# Patient Record
Sex: Male | Born: 1950 | Race: White | Hispanic: No | Marital: Married | Smoking: Never smoker
Health system: Southern US, Community
[De-identification: ages and names within clinical notes are randomized; demographics above are authoritative.]

## PROBLEM LIST (undated history)

## (undated) DIAGNOSIS — K219 Gastro-esophageal reflux disease without esophagitis: Secondary | ICD-10-CM

## (undated) DIAGNOSIS — J45909 Unspecified asthma, uncomplicated: Secondary | ICD-10-CM

## (undated) DIAGNOSIS — I10 Essential (primary) hypertension: Secondary | ICD-10-CM

## (undated) DIAGNOSIS — E785 Hyperlipidemia, unspecified: Secondary | ICD-10-CM

## (undated) HISTORY — DX: Hyperlipidemia, unspecified: E78.5

## (undated) HISTORY — DX: Unspecified asthma, uncomplicated: J45.909

## (undated) HISTORY — DX: Gastro-esophageal reflux disease without esophagitis: K21.9

## (undated) HISTORY — DX: Essential (primary) hypertension: I10

---
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
## 1970-09-09 HISTORY — PX: APPENDECTOMY: SHX54

## 1983-09-10 HISTORY — PX: VASECTOMY: SHX75

## 1998-04-06 ENCOUNTER — Ambulatory Visit (HOSPITAL_COMMUNITY): Admission: RE | Admit: 1998-04-06 | Discharge: 1998-04-06 | Payer: Self-pay | Admitting: Cardiology

## 1998-06-26 ENCOUNTER — Ambulatory Visit (HOSPITAL_COMMUNITY): Admission: RE | Admit: 1998-06-26 | Discharge: 1998-06-26 | Payer: Self-pay | Admitting: Gastroenterology

## 2004-01-09 ENCOUNTER — Ambulatory Visit (HOSPITAL_COMMUNITY): Admission: RE | Admit: 2004-01-09 | Discharge: 2004-01-09 | Payer: Self-pay | Admitting: Gastroenterology

## 2004-07-19 ENCOUNTER — Ambulatory Visit (HOSPITAL_COMMUNITY): Admission: RE | Admit: 2004-07-19 | Discharge: 2004-07-19 | Payer: Self-pay | Admitting: Family Medicine

## 2004-07-24 ENCOUNTER — Ambulatory Visit (HOSPITAL_COMMUNITY): Admission: RE | Admit: 2004-07-24 | Discharge: 2004-07-24 | Payer: Self-pay | Admitting: Family Medicine

## 2004-08-07 ENCOUNTER — Ambulatory Visit (HOSPITAL_BASED_OUTPATIENT_CLINIC_OR_DEPARTMENT_OTHER): Admission: RE | Admit: 2004-08-07 | Discharge: 2004-08-07 | Payer: Self-pay | Admitting: Family Medicine

## 2012-02-08 ENCOUNTER — Emergency Department: Payer: Self-pay | Admitting: Emergency Medicine

## 2014-12-27 ENCOUNTER — Other Ambulatory Visit: Payer: Self-pay | Admitting: Orthopedic Surgery

## 2014-12-27 DIAGNOSIS — M25512 Pain in left shoulder: Secondary | ICD-10-CM

## 2015-01-05 ENCOUNTER — Ambulatory Visit
Admission: RE | Admit: 2015-01-05 | Discharge: 2015-01-05 | Disposition: A | Discharge status: Home / Self Care | Payer: Self-pay | Source: Ambulatory Visit | Attending: Orthopedic Surgery | Admitting: Orthopedic Surgery

## 2015-01-05 DIAGNOSIS — M25512 Pain in left shoulder: Secondary | ICD-10-CM

## 2015-09-10 HISTORY — PX: ROTATOR CUFF REPAIR: SHX139

## 2016-09-09 HISTORY — PX: BACK SURGERY: SHX140

## 2017-05-02 ENCOUNTER — Other Ambulatory Visit: Payer: Self-pay | Admitting: Orthopedic Surgery

## 2017-05-02 DIAGNOSIS — G8929 Other chronic pain: Secondary | ICD-10-CM

## 2017-05-02 DIAGNOSIS — M25561 Pain in right knee: Principal | ICD-10-CM

## 2017-05-04 ENCOUNTER — Ambulatory Visit
Admission: RE | Admit: 2017-05-04 | Discharge: 2017-05-04 | Disposition: A | Discharge status: Home / Self Care | Payer: PRIVATE HEALTH INSURANCE | Source: Ambulatory Visit | Attending: Orthopedic Surgery | Admitting: Orthopedic Surgery

## 2017-05-04 DIAGNOSIS — G8929 Other chronic pain: Secondary | ICD-10-CM

## 2017-05-04 DIAGNOSIS — M25561 Pain in right knee: Principal | ICD-10-CM

## 2018-04-06 ENCOUNTER — Telehealth: Payer: Self-pay

## 2018-04-06 NOTE — Telephone Encounter (Signed)
SENT REFERRAL TO SCHEDULING AND FILED NOTES 

## 2018-05-20 NOTE — Progress Notes (Signed)
  Cardiology Office Note   Date:  05/21/2018   ID:  Jesse Hardy, DOB 11/30/1950, MRN [id number]  PCP:  Briscoe, Kim K, MD  Cardiologist:   No primary care provider on file. Referring:  Briscoe, Kim K, MD   Chief Complaint  Patient presents with  . Heart Murmur      History of Present Illness: Jesse Hardy is a 66 y.o. male who is referred by Briscoe, Kim K, MD for evaluation of murmur.  The patient has no past cardiac history but he does have a history of a heart murmur as a child.  He had a treadmill test years ago that he describes as a nuclear stress test but apparently it was normal.  He had bradycardia noted but no symptoms related to this.  He was referred for evaluation of the murmur.  He also has a family history with his brother having heart disease a young age.  He is active.  He does get a ditch recently and he had no problems with this.  However, he has been limited by some back and leg pain related to his back.  He is going to get this looked at.  He does some walking.  He might get some shortness of breath with this.  He is had some fleeting chest discomfort.  This is happens sporadically and not associated with activity. The patient denies any new symptoms such as chest discomfort, neck or arm discomfort. There has been no PND or orthopnea. There have been no reported palpitations, presyncope or syncope.  Past Medical History:  Diagnosis Date  . Asthma   . GERD (gastroesophageal reflux disease)   . Hyperlipidemia   . Hypertension    Past Surgical History:  Procedure Laterality Date  . APPENDECTOMY  1972  . BACK SURGERY  2018  . ROTATOR CUFF REPAIR  2017  . TONSILECTOMY, ADENOIDECTOMY, BILATERAL MYRINGOTOMY AND TUBES  1958  . VASECTOMY  1985    Current Outpatient Medications  Medication Sig Dispense Refill  . ammonium lactate (LAC-HYDRIN) 12 % lotion Apply topically as needed. For dry skin    . aspirin 81 MG tablet Take 325 mg by mouth daily.    .  azelastine (ASTELIN) 0.1 % nasal spray Place 1 spray into both nostrils 2 (two) times daily as needed.    . chlorthalidone (HYGROTON) 25 MG tablet Take 1 tablet by mouth daily.    . clotrimazole (LOTRIMIN) 1 % cream Apply topically 2 (two) times daily.    . cyclobenzaprine (FLEXERIL) 10 MG tablet Take 1 tablet by mouth 3 (three) times daily as needed. For muscle spasms    . doxazosin (CARDURA) 2 MG tablet Take 3 tablets by mouth at bedtime.    . ipratropium (ATROVENT) 0.03 % nasal spray Place 1 spray into both nostrils 2 (two) times daily as needed.    . losartan (COZAAR) 50 MG tablet Take 1 tablet by mouth daily.    . meloxicam (MOBIC) 15 MG tablet Take 1 tablet by mouth daily.    . montelukast (SINGULAIR) 10 MG tablet Take 1 tablet by mouth at bedtime.    . Multiple Vitamin (MULTIVITAMIN) tablet Take 1 tablet by mouth daily.    . Omega-3 Fatty Acids (FISH OIL OMEGA-3) 1000 MG CAPS Take 2 g by mouth daily.    . omeprazole (PRILOSEC) 20 MG capsule Take 1 capsule by mouth daily.    . pravastatin (PRAVACHOL) 20 MG tablet Take 1 tablet by mouth daily.    .   ranitidine (ZANTAC) 300 MG tablet Take 1 tablet by mouth daily.    . tamsulosin (FLOMAX) 0.4 MG CAPS capsule Take 1 capsule by mouth daily.    . triamcinolone cream (KENALOG) 0.1 % Apply 1 application topically 2 (two) times daily as needed.  1   No current facility-administered medications for this visit.     Allergies:   Patient has no allergy information on record.    Social History:  The patient  reports that he has never smoked. He has never used smokeless tobacco. He reports that he does not drink alcohol or use drugs.   Family History:  The patient's family history includes Colon cancer in his father; Heart attack in his father and mother; Heart attack (age of onset: 53) in his brother; Heart disease in his brother; Stroke in his father.    ROS:  Please see the history of present illness.   Otherwise, review of systems are positive  for none.   All other systems are reviewed and negative.    PHYSICAL EXAM: VS:  BP 106/72 (BP Location: Right Arm, Patient Position: Sitting, Cuff Size: Normal)   Pulse (!) 47   Ht 5' 6" (1.676 m)   Wt 214 lb (97.1 kg)   BMI 34.54 kg/m  , BMI Body mass index is 34.54 kg/m. GENERAL:  Well appearing HEENT:  Pupils equal round and reactive, fundi not visualized, oral mucosa unremarkable NECK:  No jugular venous distention, waveform within normal limits, carotid upstroke brisk and symmetric, no bruits, no thyromegaly LYMPHATICS:  No cervical, inguinal adenopathy LUNGS:  Clear to auscultation bilaterally BACK:  No CVA tenderness CHEST:  Unremarkable HEART:  PMI not displaced or sustained,S1 and S2 within normal limits, no S3, no S4, no clicks, no rubs, 2 out of 6 apical systolic murmur heard best at the right upper sternal border nonradiating, no diastolic murmurs ABD:  Flat, positive bowel sounds normal in frequency in pitch, no bruits, no rebound, no guarding, no midline pulsatile mass, no hepatomegaly, no splenomegaly EXT:  2 plus pulses throughout, mild left leg edema, no cyanosis no clubbing SKIN:  No rashes no nodules NEURO:  Cranial nerves II through XII grossly intact, motor grossly intact throughout PSYCH:  Cognitively intact, oriented to person place and time    EKG:  EKG is ordered today. The ekg ordered today demonstrates sinus bradycardia, rate 47, axis within normal limits, intervals within normal limits, no acute ST-T wave changes.     Recent Labs: No results found for requested labs within last 8760 hours.    Lipid Panel No results found for: CHOL, TRIG, HDL, CHOLHDL, VLDL, LDLCALC, LDLDIRECT    Wt Readings from Last 3 Encounters:  05/21/18 214 lb (97.1 kg)      Other studies Reviewed: Additional studies/ records that were reviewed today include: Outside labs. Review of the above records demonstrates:  Please see elsewhere in the note.     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  No further work-up is suggested.  MURMUR: I suggest that this is likely related to aortic sclerosis or mild stenosis but he will get an echocardiogram to follow-up.  SOB/CHEST PAIN: Given this  and his family history I will bring the patient back for a POET (Plain Old Exercise Test). This will allow me to screen for obstructive coronary disease, risk stratify and very importantly provide a prescription for exercise.  Current medicines are reviewed at length with   the patient today.  The patient does not have concerns regarding medicines.  The following changes have been made:  no change  Labs/ tests ordered today include:   Orders Placed This Encounter  Procedures  . Exercise Tolerance Test  . EKG 12-Lead  . ECHOCARDIOGRAM COMPLETE     Disposition:   FU with me based on the results of the above.     Signed, Pama Roskos, MD  05/21/2018 11:27 AM    [facility]    

## 2018-05-20 NOTE — H&P (View-Only) (Signed)
Cardiology Office Note   Date:  05/21/2018   ID:  Jesse Hardy, DOB 12-Jun-1951, MRN [id number]  PCP:  Macy Mis, MD  Cardiologist:   No primary care provider on file. Referring:  Macy Mis, MD   Chief Complaint  Patient presents with  . Heart Murmur      History of Present Illness: Jesse Hardy is a 67 y.o. male who is referred by Macy Mis, MD for evaluation of murmur.  The patient has no past cardiac history but he does have a history of a heart murmur as a child.  He had a treadmill test years ago that he describes as a nuclear stress test but apparently it was normal.  He had bradycardia noted but no symptoms related to this.  He was referred for evaluation of the murmur.  He also has a family history with his brother having heart disease a young age.  He is active.  He does get a ditch recently and he had no problems with this.  However, he has been limited by some back and leg pain related to his back.  He is going to get this looked at.  He does some walking.  He might get some shortness of breath with this.  He is had some fleeting chest discomfort.  This is happens sporadically and not associated with activity. The patient denies any new symptoms such as chest discomfort, neck or arm discomfort. There has been no PND or orthopnea. There have been no reported palpitations, presyncope or syncope.  Past Medical History:  Diagnosis Date  . Asthma   . GERD (gastroesophageal reflux disease)   . Hyperlipidemia   . Hypertension    Past Surgical History:  Procedure Laterality Date  . APPENDECTOMY  1972  . BACK SURGERY  2018  . ROTATOR CUFF REPAIR  2017  . TONSILECTOMY, ADENOIDECTOMY, BILATERAL MYRINGOTOMY AND TUBES  1958  . VASECTOMY  1985    Current Outpatient Medications  Medication Sig Dispense Refill  . ammonium lactate (LAC-HYDRIN) 12 % lotion Apply topically as needed. For dry skin    . aspirin 81 MG tablet Take 325 mg by mouth daily.    Marland Kitchen  azelastine (ASTELIN) 0.1 % nasal spray Place 1 spray into both nostrils 2 (two) times daily as needed.    . chlorthalidone (HYGROTON) 25 MG tablet Take 1 tablet by mouth daily.    . clotrimazole (LOTRIMIN) 1 % cream Apply topically 2 (two) times daily.    . cyclobenzaprine (FLEXERIL) 10 MG tablet Take 1 tablet by mouth 3 (three) times daily as needed. For muscle spasms    . doxazosin (CARDURA) 2 MG tablet Take 3 tablets by mouth at bedtime.    Marland Kitchen ipratropium (ATROVENT) 0.03 % nasal spray Place 1 spray into both nostrils 2 (two) times daily as needed.    Marland Kitchen losartan (COZAAR) 50 MG tablet Take 1 tablet by mouth daily.    . meloxicam (MOBIC) 15 MG tablet Take 1 tablet by mouth daily.    . montelukast (SINGULAIR) 10 MG tablet Take 1 tablet by mouth at bedtime.    . Multiple Vitamin (MULTIVITAMIN) tablet Take 1 tablet by mouth daily.    . Omega-3 Fatty Acids (FISH OIL OMEGA-3) 1000 MG CAPS Take 2 g by mouth daily.    Marland Kitchen omeprazole (PRILOSEC) 20 MG capsule Take 1 capsule by mouth daily.    . pravastatin (PRAVACHOL) 20 MG tablet Take 1 tablet by mouth daily.    Marland Kitchen  ranitidine (ZANTAC) 300 MG tablet Take 1 tablet by mouth daily.    . tamsulosin (FLOMAX) 0.4 MG CAPS capsule Take 1 capsule by mouth daily.    Marland Kitchen triamcinolone cream (KENALOG) 0.1 % Apply 1 application topically 2 (two) times daily as needed.  1   No current facility-administered medications for this visit.     Allergies:   Patient has no allergy information on record.    Social History:  The patient  reports that he has never smoked. He has never used smokeless tobacco. He reports that he does not drink alcohol or use drugs.   Family History:  The patient's family history includes Colon cancer in his father; Heart attack in his father and mother; Heart attack (age of onset: 47) in his brother; Heart disease in his brother; Stroke in his father.    ROS:  Please see the history of present illness.   Otherwise, review of systems are positive  for none.   All other systems are reviewed and negative.    PHYSICAL EXAM: VS:  BP 106/72 (BP Location: Right Arm, Patient Position: Sitting, Cuff Size: Normal)   Pulse (!) 47   Ht 5\' 6"  (1.676 m)   Wt 214 lb (97.1 kg)   BMI 34.54 kg/m  , BMI Body mass index is 34.54 kg/m. GENERAL:  Well appearing HEENT:  Pupils equal round and reactive, fundi not visualized, oral mucosa unremarkable NECK:  No jugular venous distention, waveform within normal limits, carotid upstroke brisk and symmetric, no bruits, no thyromegaly LYMPHATICS:  No cervical, inguinal adenopathy LUNGS:  Clear to auscultation bilaterally BACK:  No CVA tenderness CHEST:  Unremarkable HEART:  PMI not displaced or sustained,S1 and S2 within normal limits, no S3, no S4, no clicks, no rubs, 2 out of 6 apical systolic murmur heard best at the right upper sternal border nonradiating, no diastolic murmurs ABD:  Flat, positive bowel sounds normal in frequency in pitch, no bruits, no rebound, no guarding, no midline pulsatile mass, no hepatomegaly, no splenomegaly EXT:  2 plus pulses throughout, mild left leg edema, no cyanosis no clubbing SKIN:  No rashes no nodules NEURO:  Cranial nerves II through XII grossly intact, motor grossly intact throughout PSYCH:  Cognitively intact, oriented to person place and time    EKG:  EKG is ordered today. The ekg ordered today demonstrates sinus bradycardia, rate 47, axis within normal limits, intervals within normal limits, no acute ST-T wave changes.     Recent Labs: No results found for requested labs within last 8760 hours.    Lipid Panel No results found for: CHOL, TRIG, HDL, CHOLHDL, VLDL, LDLCALC, LDLDIRECT    Wt Readings from Last 3 Encounters:  05/21/18 214 lb (97.1 kg)      Other studies Reviewed: Additional studies/ records that were reviewed today include: Outside labs. Review of the above records demonstrates:  Please see elsewhere in the note.     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  No further work-up is suggested.  MURMUR: I suggest that this is likely related to aortic sclerosis or mild stenosis but he will get an echocardiogram to follow-up.  SOB/CHEST PAIN: Given this  and his family history I will bring the patient back for a POET (Plain Old Exercise Test). This will allow me to screen for obstructive coronary disease, risk stratify and very importantly provide a prescription for exercise.  Current medicines are reviewed at length with  the patient today.  The patient does not have concerns regarding medicines.  The following changes have been made:  no change  Labs/ tests ordered today include:   Orders Placed This Encounter  Procedures  . Exercise Tolerance Test  . EKG 12-Lead  . ECHOCARDIOGRAM COMPLETE     Disposition:   FU with me based on the results of the above.     Signed, Rollene Rotunda, MD  05/21/2018 11:27 AM    [facility]

## 2018-05-21 ENCOUNTER — Ambulatory Visit: Payer: Medicare HMO | Admitting: Cardiology

## 2018-05-21 ENCOUNTER — Encounter: Payer: Self-pay | Admitting: Cardiology

## 2018-05-21 VITALS — BP 106/72 | HR 47 | Ht 66.0 in | Wt 214.0 lb

## 2018-05-21 DIAGNOSIS — R0602 Shortness of breath: Secondary | ICD-10-CM

## 2018-05-21 DIAGNOSIS — R011 Cardiac murmur, unspecified: Secondary | ICD-10-CM | POA: Diagnosis not present

## 2018-05-21 NOTE — Patient Instructions (Signed)
Medication Instructions:  Your physician recommends that you continue on your current medications as directed. Please refer to the Current Medication list given to you today.   Labwork: None ordered  Testing/Procedures: Your physician has requested that you have an echocardiogram. Echocardiography is a painless test that uses sound waves to create images of your heart. It provides your doctor with information about the size and shape of your heart and how well your heart's chambers and valves are working. This procedure takes approximately one hour. There are no restrictions for this procedure.  Your physician has requested that you have an exercise tolerance test. For further information please visit [email or web address]. Please also follow instruction sheet, as given.    Follow-Up: Your physician recommends that you schedule a follow-up appointment pending test results  Any Other Special Instructions Will Be Listed Below (If Applicable).     If you need a refill on your cardiac medications before your next appointment, please call your pharmacy.

## 2018-05-26 ENCOUNTER — Telehealth (HOSPITAL_COMMUNITY): Payer: Self-pay

## 2018-05-26 NOTE — Telephone Encounter (Signed)
Encounter complete. 

## 2018-05-28 ENCOUNTER — Ambulatory Visit (HOSPITAL_COMMUNITY)
Admission: RE | Admit: 2018-05-28 | Discharge: 2018-05-28 | Disposition: A | Discharge status: Home / Self Care | Payer: Medicare HMO | Source: Ambulatory Visit | Attending: Cardiovascular Disease | Admitting: Cardiovascular Disease

## 2018-05-28 ENCOUNTER — Other Ambulatory Visit: Payer: Self-pay

## 2018-05-28 ENCOUNTER — Encounter (HOSPITAL_COMMUNITY): Payer: Self-pay | Admitting: *Deleted

## 2018-05-28 ENCOUNTER — Ambulatory Visit (HOSPITAL_BASED_OUTPATIENT_CLINIC_OR_DEPARTMENT_OTHER): Payer: Medicare HMO

## 2018-05-28 DIAGNOSIS — R011 Cardiac murmur, unspecified: Secondary | ICD-10-CM | POA: Diagnosis present

## 2018-05-28 DIAGNOSIS — R0602 Shortness of breath: Secondary | ICD-10-CM

## 2018-05-28 LAB — EXERCISE TOLERANCE TEST
Estimated workload: 7 METS
Exercise duration (min): 6 min
Exercise duration (sec): 0 s
MPHR: 152 {beats}/min
Peak HR: 148 {beats}/min
Percent HR: 97 %
RPE: 18
Rest HR: 44 {beats}/min

## 2018-05-28 NOTE — Progress Notes (Signed)
ETT taken to Dr Duke Salviaandolph (DOD) who said pt may leave.

## 2018-06-05 ENCOUNTER — Telehealth: Payer: Self-pay | Admitting: *Deleted

## 2018-06-05 DIAGNOSIS — R9439 Abnormal result of other cardiovascular function study: Secondary | ICD-10-CM

## 2018-06-05 DIAGNOSIS — R5383 Other fatigue: Secondary | ICD-10-CM

## 2018-06-05 DIAGNOSIS — Z79899 Other long term (current) drug therapy: Secondary | ICD-10-CM

## 2018-06-05 NOTE — Telephone Encounter (Signed)
 @  [facility] CARDIOVASCULAR DIVISION [facility] [address] [place] [address] Dept: [phone] Loc: [phone]  Donn Zanetti.  06/05/2018  You are scheduled for a Cardiac Catheterization on Tuesday, October 1 with Dr. Peter Swaz[place]and.  1. Please arrive at the [facility] (Main Entrance A) at [facility]: [address] [place], [place] [address] at 8:30 AM (This time is two hours before your procedure to ensure your preparation). Free valet parking service is available.   Special note: Every effort is made to have your procedure done on time. Please understand that emergencies sometimes delay scheduled procedures.  2. Diet: Do not eat solid foods after midnight.  The patient may have clear liquids until 5am upon the day of the procedure.  3. Labs: You will need to have blood drawn on Monday, September 30 at [facility] [address], [place]  Open: 8am - 5pm (Lunch 12:30 - 1:30)   Phone: [phone]. You do not need to be fasting.  4. Medication instructions in preparation for your procedure:   Contrast Allergy: No     Stop taking, Cozaar (Losartan) on Tuesday, October 1.   On the morning of your procedure, take your Aspirin and any morning medicines NOT listed above.  You may use sips of water.  5. Plan for one night stay--bring personal belongings. 6. Bring a current list of your medications and current insurance cards. 7. You MUST have a responsible person to drive you home. 8. Someone MUST be with you the first 24 hours after you arrive home or your discharge will be delayed. 9. Please wear clothes that are easy to get on and off and wear slip-on shoes.  Thank you for allowing [place] to care for you!   -- [facility]

## 2018-06-05 NOTE — Telephone Encounter (Signed)
-----   Message from Rollene Rotunda, MD sent at 06/04/2018  5:32 PM EDT ----- Needs cardiac cath for abnormal stress test.  Call tomorrow to set up.  (Need to call in the AM).  He will need labs.  I spoke with the patient.  Send results to Macy Mis, MD

## 2018-06-08 ENCOUNTER — Telehealth: Payer: Self-pay | Admitting: *Deleted

## 2018-06-08 NOTE — Telephone Encounter (Signed)
Pt contacted pre-catheterization scheduled at [facility] for: Tuesday June 09, 2018 10:30 AM Verified arrival time and place: [facility] Main Entrance A at: 8 AM  No solid food after midnight prior to cath, clear liquids until 5 AM day of procedure. Verified allergies in Epic Verified no diabetes medications.  Hold: Chlorthalidone-AM of procedure.  Except hold medications AM meds can be  taken pre-cath with sip of water including: ASA 81 mg  Confirmed patient has responsible person to drive home post procedure and for 24 hours after you arrive home: yes

## 2018-06-09 ENCOUNTER — Encounter (HOSPITAL_COMMUNITY): Payer: Self-pay | Admitting: Cardiology

## 2018-06-09 ENCOUNTER — Ambulatory Visit (HOSPITAL_COMMUNITY)
Admission: RE | Admit: 2018-06-09 | Discharge: 2018-06-09 | Disposition: A | Discharge status: Home / Self Care | Payer: Medicare HMO | Source: Ambulatory Visit | Attending: Cardiology | Admitting: Cardiology

## 2018-06-09 ENCOUNTER — Other Ambulatory Visit: Payer: Self-pay

## 2018-06-09 ENCOUNTER — Encounter (HOSPITAL_COMMUNITY)
Admission: RE | Disposition: A | Discharge status: Home / Self Care | Payer: Self-pay | Source: Ambulatory Visit | Attending: Cardiology

## 2018-06-09 DIAGNOSIS — R0602 Shortness of breath: Secondary | ICD-10-CM | POA: Diagnosis not present

## 2018-06-09 DIAGNOSIS — Z8249 Family history of ischemic heart disease and other diseases of the circulatory system: Secondary | ICD-10-CM | POA: Diagnosis not present

## 2018-06-09 DIAGNOSIS — Z823 Family history of stroke: Secondary | ICD-10-CM | POA: Diagnosis not present

## 2018-06-09 DIAGNOSIS — E785 Hyperlipidemia, unspecified: Secondary | ICD-10-CM | POA: Insufficient documentation

## 2018-06-09 DIAGNOSIS — I10 Essential (primary) hypertension: Secondary | ICD-10-CM | POA: Insufficient documentation

## 2018-06-09 DIAGNOSIS — Z79899 Other long term (current) drug therapy: Secondary | ICD-10-CM | POA: Insufficient documentation

## 2018-06-09 DIAGNOSIS — Z9852 Vasectomy status: Secondary | ICD-10-CM | POA: Insufficient documentation

## 2018-06-09 DIAGNOSIS — I2510 Atherosclerotic heart disease of native coronary artery without angina pectoris: Secondary | ICD-10-CM | POA: Insufficient documentation

## 2018-06-09 DIAGNOSIS — R9439 Abnormal result of other cardiovascular function study: Secondary | ICD-10-CM | POA: Diagnosis not present

## 2018-06-09 DIAGNOSIS — J45909 Unspecified asthma, uncomplicated: Secondary | ICD-10-CM | POA: Diagnosis not present

## 2018-06-09 DIAGNOSIS — K219 Gastro-esophageal reflux disease without esophagitis: Secondary | ICD-10-CM | POA: Insufficient documentation

## 2018-06-09 DIAGNOSIS — Z9889 Other specified postprocedural states: Secondary | ICD-10-CM | POA: Diagnosis not present

## 2018-06-09 DIAGNOSIS — R001 Bradycardia, unspecified: Secondary | ICD-10-CM | POA: Diagnosis not present

## 2018-06-09 DIAGNOSIS — I350 Nonrheumatic aortic (valve) stenosis: Secondary | ICD-10-CM

## 2018-06-09 DIAGNOSIS — Z7982 Long term (current) use of aspirin: Secondary | ICD-10-CM | POA: Diagnosis not present

## 2018-06-09 HISTORY — PX: LEFT HEART CATH AND CORONARY ANGIOGRAPHY: CATH118249

## 2018-06-09 LAB — CBC
Hematocrit: 38.7 % (ref 37.5–51.0)
Hemoglobin: 13 g/dL (ref 13.0–17.7)
MCH: 28.6 pg (ref 26.6–33.0)
MCHC: 33.6 g/dL (ref 31.5–35.7)
MCV: 85 fL (ref 79–97)
Platelets: 216 10*3/uL (ref 150–450)
RBC: 4.55 x10E6/uL (ref 4.14–5.80)
RDW: 15.6 % — ABNORMAL HIGH (ref 12.3–15.4)
WBC: 6.8 10*3/uL (ref 3.4–10.8)

## 2018-06-09 LAB — BASIC METABOLIC PANEL
BUN/Creatinine Ratio: 19 (ref 10–24)
BUN: 27 mg/dL (ref 8–27)
CO2: 23 mmol/L (ref 20–29)
Calcium: 9.8 mg/dL (ref 8.6–10.2)
Chloride: 104 mmol/L (ref 96–106)
Creatinine, Ser: 1.42 mg/dL — ABNORMAL HIGH (ref 0.76–1.27)
GFR calc Af Amer: 58 mL/min/{1.73_m2} — ABNORMAL LOW (ref >59)
GFR calc non Af Amer: 50 mL/min/{1.73_m2} — ABNORMAL LOW (ref >59)
Glucose: 84 mg/dL (ref 65–99)
Potassium: 4.1 mmol/L (ref 3.5–5.2)
Sodium: 142 mmol/L (ref 134–144)

## 2018-06-09 LAB — TSH: TSH: 5.18 u[IU]/mL — ABNORMAL HIGH (ref 0.450–4.500)

## 2018-06-09 SURGERY — LEFT HEART CATH AND CORONARY ANGIOGRAPHY
Anesthesia: LOCAL

## 2018-06-09 MED ORDER — SODIUM CHLORIDE 0.9% FLUSH: 3.0000 mL | INTRAVENOUS | Status: DC | PRN

## 2018-06-09 MED ORDER — ASPIRIN 81 MG PO CHEW: 81.0000 mg | CHEWABLE_TABLET | ORAL | Status: DC

## 2018-06-09 MED ORDER — ACETAMINOPHEN 325 MG PO TABS: 650.0000 mg | ORAL_TABLET | ORAL | Status: DC | PRN

## 2018-06-09 MED ORDER — MIDAZOLAM HCL 2 MG/2ML IJ SOLN
INTRAMUSCULAR | Status: AC
  Filled 2018-06-09: qty 2

## 2018-06-09 MED ORDER — HEPARIN (PORCINE) IN NACL 1000-0.9 UT/500ML-% IV SOLN
INTRAVENOUS | Status: DC | PRN
  Administered 2018-06-09 (×2): 500 mL

## 2018-06-09 MED ORDER — SODIUM CHLORIDE 0.9 % IV SOLN: 250.0000 mL | INTRAVENOUS | Status: DC | PRN

## 2018-06-09 MED ORDER — FENTANYL CITRATE (PF) 100 MCG/2ML IJ SOLN
INTRAMUSCULAR | Status: AC
  Filled 2018-06-09: qty 2

## 2018-06-09 MED ORDER — VERAPAMIL HCL 2.5 MG/ML IV SOLN
INTRAVENOUS | Status: DC | PRN
  Administered 2018-06-09: 10 mL via INTRA_ARTERIAL

## 2018-06-09 MED ORDER — SODIUM CHLORIDE 0.9% FLUSH: 3.0000 mL | Freq: Two times a day (BID) | INTRAVENOUS | Status: DC

## 2018-06-09 MED ORDER — SODIUM CHLORIDE 0.9 % WEIGHT BASED INFUSION: 1.0000 mL/kg/h | INTRAVENOUS | Status: DC

## 2018-06-09 MED ORDER — LIDOCAINE HCL (PF) 1 % IJ SOLN
INTRAMUSCULAR | Status: AC
  Filled 2018-06-09: qty 30

## 2018-06-09 MED ORDER — MIDAZOLAM HCL 2 MG/2ML IJ SOLN
INTRAMUSCULAR | Status: DC | PRN
  Administered 2018-06-09: 1 mg via INTRAVENOUS

## 2018-06-09 MED ORDER — SODIUM CHLORIDE 0.9 % WEIGHT BASED INFUSION: 1.0000 mL/kg/h | INTRAVENOUS | Status: AC

## 2018-06-09 MED ORDER — HEPARIN SODIUM (PORCINE) 1000 UNIT/ML IJ SOLN
INTRAMUSCULAR | Status: DC | PRN
  Administered 2018-06-09: 5000 [IU] via INTRAVENOUS

## 2018-06-09 MED ORDER — IOHEXOL 350 MG/ML SOLN
INTRAVENOUS | Status: DC | PRN
  Administered 2018-06-09: 55 mL via INTRACARDIAC

## 2018-06-09 MED ORDER — FENTANYL CITRATE (PF) 100 MCG/2ML IJ SOLN
INTRAMUSCULAR | Status: DC | PRN
  Administered 2018-06-09: 25 ug via INTRAVENOUS

## 2018-06-09 MED ORDER — LIDOCAINE HCL (PF) 1 % IJ SOLN
INTRAMUSCULAR | Status: DC | PRN
  Administered 2018-06-09: 2 mL

## 2018-06-09 MED ORDER — ONDANSETRON HCL 4 MG/2ML IJ SOLN: 4.0000 mg | Freq: Four times a day (QID) | INTRAMUSCULAR | Status: DC | PRN

## 2018-06-09 MED ORDER — HEPARIN (PORCINE) IN NACL 1000-0.9 UT/500ML-% IV SOLN
INTRAVENOUS | Status: AC
  Filled 2018-06-09: qty 1000

## 2018-06-09 MED ORDER — SODIUM CHLORIDE 0.9 % WEIGHT BASED INFUSION
3.0000 mL/kg/h | INTRAVENOUS | Status: AC
  Administered 2018-06-09: 3 mL/kg/h via INTRAVENOUS

## 2018-06-09 MED ORDER — VERAPAMIL HCL 2.5 MG/ML IV SOLN
INTRAVENOUS | Status: AC
  Filled 2018-06-09: qty 2

## 2018-06-09 SURGICAL SUPPLY — 10 items
CATH 5FR JL3.5 JR4 ANG PIG MP (CATHETERS) ×1 IMPLANT
DEVICE RAD COMP TR BAND LRG (VASCULAR PRODUCTS) ×1 IMPLANT
GLIDESHEATH SLEND SS 6F .021 (SHEATH) ×1 IMPLANT
GUIDEWIRE INQWIRE 1.5J.035X260 (WIRE) IMPLANT
INQWIRE 1.5J .035X260CM (WIRE) ×2
KIT HEART LEFT (KITS) ×2 IMPLANT
PACK CARDIAC CATHETERIZATION (CUSTOM PROCEDURE TRAY) ×2 IMPLANT
SYR MEDRAD MARK V 150ML (SYRINGE) ×2 IMPLANT
TRANSDUCER W/STOPCOCK (MISCELLANEOUS) ×2 IMPLANT
TUBING CIL FLEX 10 FLL-RA (TUBING) ×2 IMPLANT

## 2018-06-09 NOTE — Interval H&P Note (Signed)
History and Physical Interval Note:  06/09/2018 9:20 AM  Jesse Hardy.  has presented today for surgery, with the diagnosis of AST  The various methods of treatment have been discussed with the patient and family. After consideration of risks, benefits and other options for treatment, the patient has consented to  Procedure(s): LEFT HEART CATH AND CORONARY ANGIOGRAPHY (N/A) as a surgical intervention .  The patient's history has been reviewed, patient examined, no change in status, stable for surgery.  I have reviewed the patient's chart and labs.  Questions were answered to the patient's satisfaction.   Cath Lab Visit (complete for each Cath Lab visit)  Clinical Evaluation Leading to the Procedure:   ACS: No.  Non-ACS:    Anginal Classification: CCS II  Anti-ischemic medical therapy: No Therapy  Non-Invasive Test Results: Intermediate-risk stress test findings: cardiac mortality 1-3%/year  Prior CABG: No previous CABG        Theron Arista [facility] 06/09/2018 9:20 AM

## 2018-06-09 NOTE — Discharge Instructions (Signed)
Drink plenty of fluids over next 48 hours and keep right wrist elevated at heart level for 24 hours ° °Radial Site Care °Refer to this sheet in the next few weeks. These instructions provide you with information about caring for yourself after your procedure. Your health care provider may also give you more specific instructions. Your treatment has been planned according to current medical practices, but problems sometimes occur. Call your health care provider if you have any problems or questions after your procedure. °What can I expect after the procedure? °After your procedure, it is typical to have the following: °· Bruising at the radial site that usually fades within 1-2 weeks. °· Blood collecting in the tissue (hematoma) that may be painful to the touch. It should usually decrease in size and tenderness within 1-2 weeks. ° °Follow these instructions at home: °· Take medicines only as directed by your health care provider. °· You may shower 24-48 hours after the procedure or as directed by your health care provider. Remove the bandage (dressing) and gently wash the site with plain soap and water. Pat the area dry with a clean towel. Do not rub the site, because this may cause bleeding. °· Do not take baths, swim, or use a hot tub until your health care provider approves. °· Check your insertion site every day for redness, swelling, or drainage. °· Do not apply powder or lotion to the site. °· Do not flex or bend the affected arm for 24 hours or as directed by your health care provider. °· Do not push or pull heavy objects with the affected arm for 24 hours or as directed by your health care provider. °· Do not lift over 10 lb (4.5 kg) for 5 days after your procedure or as directed by your health care provider. °· Ask your health care provider when it is okay to: °? Return to work or school. °? Resume usual physical activities or sports. °? Resume sexual activity. °· Do not drive home if you are discharged the  same day as the procedure. Have someone else drive you. °· You may drive 24 hours after the procedure unless otherwise instructed by your health care provider. °· Do not operate machinery or power tools for 24 hours after the procedure. °· If your procedure was done as an outpatient procedure, which means that you went home the same day as your procedure, a responsible adult should be with you for the first 24 hours after you arrive home. °· Keep all follow-up visits as directed by your health care provider. This is important. °Contact a health care provider if: °· You have a fever. °· You have chills. °· You have increased bleeding from the radial site. Hold pressure on the site. °Get help right away if: °· You have unusual pain at the radial site. °· You have redness, warmth, or swelling at the radial site. °· You have drainage (other than a small amount of blood on the dressing) from the radial site. °· The radial site is bleeding, and the bleeding does not stop after 30 minutes of holding steady pressure on the site. °· Your arm or hand becomes pale, cool, tingly, or numb. °This information is not intended to replace advice given to you by your health care provider. Make sure you discuss any questions you have with your health care provider. °Document Released: 09/28/2010 Document Revised: 02/01/2016 Document Reviewed: 03/14/2014 °Elsevier Interactive Patient Education © 2018 [employer] ° °

## 2018-07-03 ENCOUNTER — Telehealth: Payer: Self-pay | Admitting: Cardiology

## 2018-07-03 ENCOUNTER — Other Ambulatory Visit: Payer: Self-pay | Admitting: Physician Assistant

## 2018-07-03 DIAGNOSIS — G9589 Other specified diseases of spinal cord: Secondary | ICD-10-CM

## 2018-07-03 NOTE — Telephone Encounter (Signed)
Returned pt call. lmctb if assistance is still needed.

## 2018-07-03 NOTE — Telephone Encounter (Signed)
° ° °  Pt c/o swelling: STAT is pt has developed SOB within 24 hours  1) How much weight have you gained and in what time span? n/a  2) If swelling, where is the swelling located? Left leg  3) Are you currently taking a fluid pill? yes  4) Are you currently SOB? no  5) Do you have a log of your daily weights (if so, list)?n/a  6) Have you gained 3 pounds in a day or 5 pounds in a week? n/a  7) Have you traveled recently? no

## 2018-07-07 NOTE — Telephone Encounter (Signed)
Called patient, checked in to see how the swelling was doing. Patient states it is much better and had no issues over the weekend. I advised patient to call us back if swelling returned or if he had any issues. Patient verbalized understanding.

## 2018-07-12 ENCOUNTER — Ambulatory Visit
Admission: RE | Admit: 2018-07-12 | Discharge: 2018-07-12 | Disposition: A | Discharge status: Home / Self Care | Payer: Medicare HMO | Source: Ambulatory Visit | Attending: Physician Assistant | Admitting: Physician Assistant

## 2018-07-12 DIAGNOSIS — G9589 Other specified diseases of spinal cord: Secondary | ICD-10-CM

## 2018-07-12 MED ORDER — GADOBENATE DIMEGLUMINE 529 MG/ML IV SOLN
20.0000 mL | Freq: Once | INTRAVENOUS | Status: AC | PRN
  Administered 2018-07-12: 20 mL via INTRAVENOUS

## 2018-07-22 NOTE — Progress Notes (Signed)
Cardiology Office Note   Date:  07/23/2018   ID:  Jesse Curtobert B Svehla Jr., DOB 11/09/1950, MRN [id number]  PCP:  Macy MisBriscoe, Kim K, MD  Cardiologist:   No primary care provider on file. Referring:  Macy MisBriscoe, Kim K, MD   Chief Complaint  Patient presents with  . Chest Pain      History of Present Illness: Jesse CurtRobert B Likes Jr. is a 67 y.o. male who presents for follow up of a murmur.  He had bradycardia. He did have chest pain and had an abnormal stress test.   He subsequently had a cardiac cath which demonstrated non obstructive disease.   Echo demonstrated mild AS.    He continues to get some fleeting chest discomforts.  These are sporadic and not particularly problematic.  He is not having any shortness of breath, PND or orthopnea.  Is not having any palpitations, presyncope or syncope.  [facility].   Past Medical History:  Diagnosis Date  . Asthma   . GERD (gastroesophageal reflux disease)   . Hyperlipidemia   . Hypertension    Past Surgical History:  Procedure Laterality Date  . APPENDECTOMY  1972  . BACK SURGERY  2018  . LEFT HEART CATH AND CORONARY ANGIOGRAPHY N/A 06/09/2018   Procedure: LEFT HEART CATH AND CORONARY ANGIOGRAPHY;  Surgeon: SwazilandJordan, Peter M, MD;  Location: [facility] INVASIVE CV LAB;  Service: Cardiovascular;  Laterality: N/A;  . ROTATOR CUFF REPAIR  2017  . TONSILECTOMY, ADENOIDECTOMY, BILATERAL MYRINGOTOMY AND TUBES  1958  . VASECTOMY  1985    Current Outpatient Medications  Medication Sig Dispense Refill  . ammonium lactate (LAC-HYDRIN) 12 % lotion Apply 1 application topically as needed for dry skin.     Marland Kitchen. aspirin 81 MG tablet Take 81 mg by mouth daily.     Marland Kitchen. azelastine (ASTELIN) 0.1 % nasal spray Place 1 spray into both nostrils 2 (two) times daily as needed for rhinitis.     . chlorthalidone (HYGROTON) 25 MG tablet Take 25 mg by mouth daily.     . clotrimazole (LOTRIMIN) 1 % cream Apply 1 application topically 2 (two) times daily as needed (irritation).      . cyclobenzaprine (FLEXERIL) 10 MG tablet Take 10 mg by mouth 2 (two) times daily as needed for muscle spasms.    Marland Kitchen. doxazosin (CARDURA) 2 MG tablet Take 6 mg by mouth at bedtime.     . Emollient (LUBRIDERM EX) Apply 1 application topically daily.    . Fluticasone-Salmeterol (ADVAIR) 100-50 MCG/DOSE AEPB Inhale 1 puff into the lungs 2 (two) times daily as needed (shortness of breath).    Marland Kitchen. ipratropium (ATROVENT) 0.03 % nasal spray Place 1 spray into both nostrils 2 (two) times daily as needed for rhinitis.     Marland Kitchen. losartan (COZAAR) 25 MG tablet Take 25 mg by mouth at bedtime.    . montelukast (SINGULAIR) 10 MG tablet Take 10 mg by mouth at bedtime.     . Multiple Vitamin (MULTIVITAMIN) tablet Take 1 tablet by mouth daily.    . Omega-3 Fatty Acids (FISH OIL) 1200 MG CAPS Take 2,400 mg by mouth daily.    Marland Kitchen. omeprazole (PRILOSEC) 20 MG capsule Take 20 mg by mouth daily.     . pravastatin (PRAVACHOL) 20 MG tablet Take 20 mg by mouth daily.     . tamsulosin (FLOMAX) 0.4 MG CAPS capsule Take 0.4 mg by mouth daily after breakfast.     . triamcinolone cream (KENALOG)  0.1 % Apply 1 application topically 2 (two) times daily as needed (dryness, skin spots).   1   No current facility-administered medications for this visit.     Allergies:   Penicillins   ROS:  Please see the history of present illness.   Otherwise, review of systems are positive for none.   All other systems are reviewed and negative.    PHYSICAL EXAM: VS:  BP 110/62   Pulse 62   Ht 5\' 6"  (1.676 m)   Wt 217 lb (98.4 kg)   SpO2 97%   BMI 35.02 kg/m  , BMI Body mass index is 35.02 kg/m. GENERAL:  Well appearing NECK:  No jugular venous distention, waveform within normal limits, carotid upstroke brisk and symmetric, no bruits, no thyromegaly LUNGS:  Clear to auscultation bilaterally CHEST:  Unremarkable HEART:  PMI not displaced or sustained,S1 and S2 within normal limits, no S3, no S4, no clicks, no rubs, 2 out of 6 apical  systolic murmur radiating slightly at the aortic outflow tract and heard best at the right upper sternal border, no diastolic murmurs ABD:  Flat, positive bowel sounds normal in frequency in pitch, no bruits, no rebound, no guarding, no midline pulsatile mass, no hepatomegaly, no splenomegaly EXT:  2 plus pulses throughout, mild left greater than right leg edema, no cyanosis no clubbing   EKG:  EKG is not ordered today.   Recent Labs: 06/08/2018: BUN 27; Creatinine, Ser 1.42; Hemoglobin 13.0; Platelets 216; Potassium 4.1; Sodium 142; TSH 5.180    Lipid Panel No results found for: CHOL, TRIG, HDL, CHOLHDL, VLDL, LDLCALC, LDLDIRECT    Wt Readings from Last 3 Encounters:  07/23/18 217 lb (98.4 kg)  06/09/18 215 lb (97.5 kg)  05/21/18 214 lb (97.1 kg)      Other studies Reviewed: Additional studies/ records that were reviewed today include: echo, cath Review of the above records demonstrates:     ASSESSMENT AND PLAN:  BRADYCARDIA:   He is having no symptoms related to this and had a normal heart rate response on treadmill testing.  No further work-up is indicated.    AS:   There was mild AS.    He can have this followed clinically by his PCP.    No further imaging is indicated.   CHEST PAIN:   This is atypical not felt to be cardiac.  No further work-up is suggested.  EDEMA: He has some left greater than right leg edema.  I suggested conservative therapies and perhaps compression stockings.  He might want to see a vein and vascular specialist as I do not see a cardiac etiology to this.  He says he does not use salt excessively.  Current medicines are reviewed at length with the patient today.  The patient does not have concerns regarding medicines.  The following changes have been made:  None  Labs/ tests ordered today include: None  No orders of the defined types were placed in this encounter.    Disposition:   FU with me as needed.     Signed, Rollene Rotunda, MD    07/23/2018 10:08 AM     [facility]

## 2018-07-23 ENCOUNTER — Encounter: Payer: Self-pay | Admitting: Cardiology

## 2018-07-23 ENCOUNTER — Ambulatory Visit: Payer: Medicare HMO | Admitting: Cardiology

## 2018-07-23 VITALS — BP 110/62 | HR 62 | Ht 66.0 in | Wt 217.0 lb

## 2018-07-23 DIAGNOSIS — R079 Chest pain, unspecified: Secondary | ICD-10-CM

## 2018-07-23 DIAGNOSIS — I350 Nonrheumatic aortic (valve) stenosis: Secondary | ICD-10-CM

## 2018-07-23 DIAGNOSIS — R001 Bradycardia, unspecified: Secondary | ICD-10-CM | POA: Diagnosis not present

## 2018-07-23 NOTE — Patient Instructions (Signed)

## 2019-06-08 ENCOUNTER — Ambulatory Visit: Payer: Medicare HMO | Admitting: Cardiology

## 2019-06-08 ENCOUNTER — Other Ambulatory Visit: Payer: Self-pay

## 2019-06-08 ENCOUNTER — Encounter: Payer: Self-pay | Admitting: Cardiology

## 2019-06-08 ENCOUNTER — Ambulatory Visit (INDEPENDENT_AMBULATORY_CARE_PROVIDER_SITE_OTHER): Payer: Medicare Other | Admitting: Cardiology

## 2019-06-08 VITALS — BP 110/62 | HR 45 | Ht 66.0 in | Wt 210.8 lb

## 2019-06-08 DIAGNOSIS — Z01812 Encounter for preprocedural laboratory examination: Secondary | ICD-10-CM

## 2019-06-08 DIAGNOSIS — I720 Aneurysm of carotid artery: Secondary | ICD-10-CM | POA: Diagnosis not present

## 2019-06-08 DIAGNOSIS — I712 Thoracic aortic aneurysm, without rupture: Secondary | ICD-10-CM | POA: Diagnosis not present

## 2019-06-08 DIAGNOSIS — I7121 Aneurysm of the ascending aorta, without rupture: Secondary | ICD-10-CM

## 2019-06-08 NOTE — Progress Notes (Signed)
Cardiology Office Note   Date:  06/08/2019   ID:  Jesse Podesta., DOB October 23, 1950, MRN [id number]  PCP:  Jesse Mis, Hardy  Cardiologist:   No primary care provider on file. Referring:  Jesse Mis, Hardy   Chief Complaint  Patient presents with  . Bradycardia      History of Present Illness: Jesse Hardy. is a 68 y.o. male who presents for follow up of a murmur.  He had bradycardia. He did have chest pain and had an abnormal stress test.   He subsequently had a cardiac cath which demonstrated non obstructive disease.   Echo demonstrated mild AS.    Since I last saw him he has done well.  He does have a slow heart rhythm but he really tolerates this well.  He has not had any presyncope or syncope.  He denies any chest pressure, neck or arm discomfort.  He has some fleeting pains occasionally that he described previously.  He has some shooting pains down his legs rarely.  He walks 2 miles every day.  He does yard work.  With this he has no significant limitations.   Past Medical History:  Diagnosis Date  . Asthma   . GERD (gastroesophageal reflux disease)   . Hyperlipidemia   . Hypertension    Past Surgical History:  Procedure Laterality Date  . APPENDECTOMY  1972  . BACK SURGERY  2018  . LEFT HEART CATH AND CORONARY ANGIOGRAPHY N/A 06/09/2018   Procedure: LEFT HEART CATH AND CORONARY ANGIOGRAPHY;  Surgeon: [place], Jesse Hardy;  Location: [facility] INVASIVE CV LAB;  Service: Cardiovascular;  Laterality: N/A;  . ROTATOR CUFF REPAIR  2017  . TONSILECTOMY, ADENOIDECTOMY, BILATERAL MYRINGOTOMY AND TUBES  1958  . VASECTOMY  1985    Current Outpatient Medications  Medication Sig Dispense Refill  . ammonium lactate (LAC-HYDRIN) 12 % lotion Apply 1 application topically as needed for dry skin.     Marland Kitchen aspirin 81 MG tablet Take 81 mg by mouth daily.     Marland Kitchen azelastine (ASTELIN) 0.1 % nasal spray Place 1 spray into both nostrils 2 (two) times daily as needed for rhinitis.     .  chlorthalidone (HYGROTON) 25 MG tablet Take 25 mg by mouth daily.     . clotrimazole (LOTRIMIN) 1 % cream Apply 1 application topically 2 (two) times daily as needed (irritation).     . cyclobenzaprine (FLEXERIL) 10 MG tablet Take 10 mg by mouth 2 (two) times daily as needed for muscle spasms.    Marland Kitchen doxazosin (CARDURA) 2 MG tablet Take 6 mg by mouth every morning.     . Emollient (LUBRIDERM EX) Apply 1 application topically daily.    . Fluticasone-Salmeterol (ADVAIR) 100-50 MCG/DOSE AEPB Inhale 1 puff into the lungs 2 (two) times daily as needed (shortness of breath).    Marland Kitchen ipratropium (ATROVENT) 0.03 % nasal spray Place 1 spray into both nostrils 2 (two) times daily as needed for rhinitis.     Marland Kitchen losartan (COZAAR) 25 MG tablet Take 25 mg by mouth at bedtime.    . Multiple Vitamin (MULTIVITAMIN) tablet Take 1 tablet by mouth daily.    . Omega-3 Fatty Acids (FISH OIL) 1200 MG CAPS Take 2,400 mg by mouth daily.    Marland Kitchen omeprazole (PRILOSEC) 20 MG capsule Take 20 mg by mouth daily.     . pravastatin (PRAVACHOL) 20 MG tablet Take 20 mg by mouth daily.     Marland Kitchen  tamsulosin (FLOMAX) 0.4 MG CAPS capsule Take 0.4 mg by mouth daily after breakfast.     . triamcinolone cream (KENALOG) 0.1 % Apply 1 application topically 2 (two) times daily as needed (dryness, skin spots).   1   No current facility-administered medications for this visit.     Allergies:   Penicillins   ROS:  Please see the history of present illness.   Otherwise, review of systems are positive for ED.   All other systems are reviewed and negative.    PHYSICAL EXAM: VS:  BP 110/62   Pulse (!) 45   Ht 5\' 6"  (1.676 m)   Wt 210 lb 12.8 oz (95.6 kg)   BMI 34.02 kg/m  , BMI Body mass index is 34.02 kg/m. GENERAL:  Well appearing NECK:  No jugular venous distention, waveform within normal limits, carotid upstroke brisk and symmetric, no bruits, no thyromegaly LUNGS:  Clear to auscultation bilaterally CHEST:  Unremarkable HEART:  PMI not  displaced or sustained,S1 and S2 within normal limits, no S3, no S4, no clicks, no rubs, 2 out of 6 apical systolic murmur radiating slightly at the aortic outflow tract and up to the carotids, no diastolic murmurs ABD:  Flat, positive bowel sounds normal in frequency in pitch, no bruits, no rebound, no guarding, no midline pulsatile mass, no hepatomegaly, no splenomegaly EXT:  2 plus pulses throughout, no edema, no cyanosis no clubbing   EKG:  EKG is ordered today. Sinus bradycardia, rate 45, axis within normal limits, intervals within normal limits, no acute ST-T wave changes.  Recent Labs: No results found for requested labs within last 8760 hours.    Lipid Panel No results found for: CHOL, TRIG, HDL, CHOLHDL, VLDL, LDLCALC, LDLDIRECT    Wt Readings from Last 3 Encounters:  06/08/19 210 lb 12.8 oz (95.6 kg)  07/23/18 217 lb (98.4 kg)  06/09/18 215 lb (97.5 kg)      Other studies Reviewed: Additional studies/ records that were reviewed today include: echo, cath Review of the above records demonstrates:  See below   ASSESSMENT AND PLAN:  BRADYCARDIA:     He is not bothered by his bradycardia.  We talked about this.  If he has symptoms in the future he will let me know.  No change in therapy.   AS:   There was mild AS.  I will follow this clinically.  No further imaging at this point.   CHEST PAIN:    He has atypical chest pain and nonobstructive coronary disease.  No further work-up.  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  Current medicines are reviewed at length with the patient today.  The patient does not have concerns regarding medicines.  The following changes have been made:  None  Labs/ tests ordered today include:   Orders Placed This Encounter  Procedures  . CT ANGIO CHEST AORTA W &/OR WO CONTRAST  . Basic metabolic panel  . EKG 12-Lead     Disposition:   FU with me in one year.    Signed,  Jesse Breeding, Hardy  06/08/2019 7:32 PM    [facility]

## 2019-06-08 NOTE — Patient Instructions (Signed)
Medication Instructions:  Your physician recommends that you continue on your current medications as directed. Please refer to the Current Medication list given to you today.  If you need a refill on your cardiac medications before your next appointment, please call your pharmacy.   Lab work: NONE  Testing/Procedures: Non-Cardiac CT Angiography (CTA), is a special type of CT scan that uses a computer to produce multi-dimensional views of major blood vessels throughout the body. In CT angiography, a contrast material is injected through an IV to help visualize the blood vessels   Follow-Up: At [facility], you and your health needs are our priority.  As part of our continuing mission to provide you with exceptional heart care, we have created designated Provider Care Teams.  These Care Teams include your primary Cardiologist (physician) and Advanced Practice Providers (APPs -  Physician Assistants and Nurse Practitioners) who all work together to provide you with the care you need, when you need it. You will need a follow up appointment in 12 months.  Please call our office 2 months in advance to schedule this appointment.  You may see Dr. Antoine Poche or one of the following Advanced Practice Providers on your designated Care Team:   Theodore Demark, PA-C Joni Reining, DNP, ANP          CT Angiogram  A CT angiogram is a procedure to look at the blood vessels in various areas of the body. For this procedure, a large X-ray machine, called a CT scanner, takes detailed pictures of blood vessels that have been injected with a dye (contrast material). A CT angiogram allows your health care provider to see how well blood is flowing to the area of your body that is being checked. Your health care provider will be able to see if there are any problems, such as a blockage. Tell a health care provider about:  Any allergies you have.  All medicines you are taking, including vitamins, herbs,  eye drops, creams, and over-the-counter medicines.  Any problems you or family members have had with anesthetic medicines.  Any blood disorders you have.  Any surgeries you have had.  Any medical conditions you have.  Whether you are pregnant or may be pregnant.  Whether you are breastfeeding.  Any anxiety disorders, chronic pain, or other conditions you have that may increase your stress or prevent you from lying still. What are the risks? Generally, this is a safe procedure. However, problems may occur, including:  Infection.  Bleeding.  Allergic reactions to medicines or dyes.  Damage to other structures or organs.  Kidney damage from the dye or contrast that is used.  Increased risk of cancer from radiation exposure. This risk is low. Talk with your health care provider about: ? The risks and benefits of testing. ? How you can receive the lowest dose of radiation. What happens before the procedure?  Wear comfortable clothing and remove any jewelry.  Follow instructions from your health care provider about eating and drinking. For most people, instructions may include these actions: ? For 12 hours before the test, avoid caffeine. This includes tea, coffee, soda, and energy drinks or pills. ? For 3-4 hours before the test, stop eating or drinking anything but water. ? Stay well hydrated by continuing to drink water before the exam. This will help to clear the contrast dye from your body after the test.  Ask your health care provider about changing or stopping your regular medicines. This is especially important if you  are taking diabetes medicines or blood thinners. What happens during the procedure?  An IV tube will be inserted into one of your veins.  You will be asked to lie on an exam table. This table will slide in and out of the CT machine during the procedure.  Contrast dye will be injected into the IV tube. You might feel warm, or you may get a metallic taste  in your mouth.  The table that you are lying on will move into the CT machine tunnel for the scan.  The person running the machine will give you instructions while the scans are being done. You may be asked to: ? Keep your arms above your head. ? Hold your breath. ? Stay very still, even if the table is moving.  When the scanning is complete, you will be moved out of the machine.  The IV tube will be removed. The procedure may vary among health care providers and hospitals. What happens after the procedure?  You might feel warm, or you may get a metallic taste in your mouth.  You may be asked to drink water or other fluids to wash (flush) the contrast material out of your body.  It is up to you to get the results of your procedure. Ask your health care provider, or the department that is doing the procedure, when your results will be ready. Summary  A CT angiogram is a procedure to look at the blood vessels in various areas of the body.  You will need to stay very still during the exam.  You may be asked to drink water or other fluids to wash (flush) the contrast material out of your body after your scan. This information is not intended to replace advice given to you by your health care provider. Make sure you discuss any questions you have with your health care provider. Document Released: 04/25/2016 Document Revised: 11/05/2018 Document Reviewed: 04/25/2016 Elsevier Patient Education  2020 [employer].

## 2019-06-29 ENCOUNTER — Other Ambulatory Visit: Payer: Medicare Other

## 2019-06-29 ENCOUNTER — Other Ambulatory Visit: Payer: Self-pay

## 2019-06-29 LAB — BASIC METABOLIC PANEL
BUN/Creatinine Ratio: 15 (ref 10–24)
BUN: 17 mg/dL (ref 8–27)
CO2: 27 mmol/L (ref 20–29)
Calcium: 10.2 mg/dL (ref 8.6–10.2)
Chloride: 103 mmol/L (ref 96–106)
Creatinine, Ser: 1.14 mg/dL (ref 0.76–1.27)
GFR calc Af Amer: 75 mL/min/{1.73_m2} (ref >59)
GFR calc non Af Amer: 65 mL/min/{1.73_m2} (ref >59)
Glucose: 98 mg/dL (ref 65–99)
Potassium: 3.6 mmol/L (ref 3.5–5.2)
Sodium: 135 mmol/L (ref 134–144)

## 2019-06-30 ENCOUNTER — Ambulatory Visit (INDEPENDENT_AMBULATORY_CARE_PROVIDER_SITE_OTHER)
Admission: RE | Admit: 2019-06-30 | Discharge: 2019-06-30 | Disposition: A | Discharge status: Home / Self Care | Payer: Medicare Other | Source: Ambulatory Visit | Attending: Cardiology | Admitting: Cardiology

## 2019-06-30 DIAGNOSIS — I7121 Aneurysm of the ascending aorta, without rupture: Secondary | ICD-10-CM

## 2019-06-30 DIAGNOSIS — I712 Thoracic aortic aneurysm, without rupture: Secondary | ICD-10-CM | POA: Diagnosis not present

## 2019-06-30 MED ORDER — IOHEXOL 350 MG/ML SOLN
100.0000 mL | Freq: Once | INTRAVENOUS | Status: AC | PRN
  Administered 2019-06-30: 100 mL via INTRAVENOUS

## 2020-06-06 DIAGNOSIS — I7121 Aneurysm of the ascending aorta, without rupture: Secondary | ICD-10-CM | POA: Insufficient documentation

## 2020-06-06 DIAGNOSIS — I700 Atherosclerosis of aorta: Secondary | ICD-10-CM | POA: Insufficient documentation

## 2020-06-06 NOTE — Progress Notes (Signed)
Cardiology Office Note   Date:  06/08/2020   ID:  Jesse Hardy., DOB [birth year], MRN [id number]  PCP:  Macy Mis, MD  Cardiologist:   No primary care provider on file. Referring:  Macy Mis, MD   Chief Complaint  Patient presents with  . Heart Murmur      History of Present Illness: Jesse Hardy. is a 69 y.o. male who presents for follow up of a murmur.  He had bradycardia. He did have chest pain and had an abnormal stress test.   He subsequently had a cardiac cath which demonstrated non obstructive disease.   Echo demonstrated mild AS.   Since I last saw him he has done well.  He has lost about.  He lives a week.  He denies any cardiovascular symptoms. The patient denies any new symptoms such as chest discomfort, neck or arm discomfort. There has been no new shortness of breath, PND or orthopnea. There have been no reported palpitations, presyncope or syncope.    Past Medical History:  Diagnosis Date  . Asthma   . GERD (gastroesophageal reflux disease)   . Hyperlipidemia   . Hypertension    Past Surgical History:  Procedure Laterality Date  . APPENDECTOMY  1972  . BACK SURGERY  2018  . LEFT HEART CATH AND CORONARY ANGIOGRAPHY N/A 06/09/2018   Procedure: LEFT HEART CATH AND CORONARY ANGIOGRAPHY;  Surgeon: [place]azil[place], Peter M, MD;  Location: [facility] INVASIVE CV LAB;  Service: Cardiovascular;  Laterality: N/A;  . ROTATOR CUFF REPAIR  2017  . TONSILECTOMY, ADENOIDECTOMY, BILATERAL MYRINGOTOMY AND TUBES  1958  . VASECTOMY  1985    Current Outpatient Medications  Medication Sig Dispense Refill  . ammonium lactate (LAC-HYDRIN) 12 % lotion Apply 1 application topically as needed for dry skin.     Marland Kitchen aspirin 81 MG tablet Take 81 mg by mouth daily.     . clotrimazole (LOTRIMIN) 1 % cream Apply 1 application topically 2 (two) times daily as needed (irritation).     Marland Kitchen doxazosin (CARDURA) 2 MG tablet Take 6 mg by mouth every morning.     . Fluticasone-Salmeterol (ADVAIR)  100-50 MCG/DOSE AEPB Inhale 1 puff into the lungs 2 (two) times daily as needed (shortness of breath).    . losartan (COZAAR) 25 MG tablet Take 25 mg by mouth at bedtime.    . montelukast (SINGULAIR) 10 MG tablet Take 10 mg by mouth at bedtime.    . Multiple Vitamin (MULTIVITAMIN) tablet Take 1 tablet by mouth daily.    . Omega-3 Fatty Acids (FISH OIL) 1200 MG CAPS Take 2,400 mg by mouth daily.    Marland Kitchen omeprazole (PRILOSEC) 20 MG capsule Take 20 mg by mouth daily.     . pravastatin (PRAVACHOL) 40 MG tablet Take 1 tablet (40 mg total) by mouth daily. 90 tablet 3  . triamcinolone cream (KENALOG) 0.1 % Apply 1 application topically 2 (two) times daily as needed (dryness, skin spots).   1  . tamsulosin (FLOMAX) 0.4 MG CAPS capsule Take 0.4 mg by mouth daily after breakfast.      No current facility-administered medications for this visit.    Allergies:   Penicillins   ROS:  Please see the history of present illness.   Otherwise, review of systems are positive for none.   All other systems are reviewed and negative.    PHYSICAL EXAM: VS:  BP 138/76   Pulse (!) 50   Ht 5\' 6"  (  1.676 m)   Wt 200 lb 12.8 oz (91.1 kg)   SpO2 95%   BMI 32.41 kg/m  , BMI Body mass index is 32.41 kg/m. GENERAL:  Well appearing NECK:  No jugular venous distention, waveform within normal limits, carotid upstroke brisk and symmetric, no bruits, no thyromegaly LUNGS:  Clear to auscultation bilaterally CHEST:  Unremarkable HEART:  PMI not displaced or sustained,S1 and S2 within normal limits, no S3, no S4, no clicks, no rubs, 2 out of 6 apical systolic murmur radiating out aortic outflow tract, no diastolic murmurs ABD:  Flat, positive bowel sounds normal in frequency in pitch, no bruits, no rebound, no guarding, no midline pulsatile mass, no hepatomegaly, no splenomegaly EXT:  2 plus pulses throughout, no edema, no cyanosis no clubbing   EKG:  EKG is  ordered today. Sinus bradycardia, rate 45, axis within normal  limits, intervals within normal limits, no acute ST-T wave changes.  Recent Labs: 06/29/2019: BUN 17; Creatinine, Ser 1.14; Potassium 3.6; Sodium 135    Lipid Panel No results found for: CHOL, TRIG, HDL, CHOLHDL, VLDL, LDLCALC, LDLDIRECT    Wt Readings from Last 3 Encounters:  06/08/20 200 lb 12.8 oz (91.1 kg)  06/08/19 210 lb 12.8 oz (95.6 kg)  07/23/18 217 lb (98.4 kg)      Other studies Reviewed: Additional studies/ records that were reviewed today include: Labs Review of the above records demonstrates:  See below   ASSESSMENT AND PLAN:  BRADYCARDIA:    He is not symptomatic with this.  No change therapy.     AS:   There was mild AS in 2019.  The murmur seems to be a little more prominent and later in systole.  I am going to repeat an echocardiogram.   CHEST PAIN:    He has atypical chest pain and nonobstructive coronary disease.  He is having no further chest discomfort other than previously described.   AORTIC ATHEROSCLEROSIS.   We are going to practice aggressive risk reduction as below.  ASCENDING AORTIC ANEURYSM: He was 41 mm in 2020 October.  I will follow this up in October 2022.  We will be assessing the size of the time of his echo as well.   DYSLIPIDEMIA: Patient's LDL is above target at 86.  I am going to increase his pravastatin to 40 mg.  Current medicines are reviewed at length with the patient today.  The patient does not have concerns regarding medicines.  The following changes have been made:  None  Labs/ tests ordered today include:   Orders Placed This Encounter  Procedures  . EKG 12-Lead  . ECHOCARDIOGRAM COMPLETE     Disposition:   FU with me in one year.    Signed, Rollene Rotunda, MD  06/08/2020 4:36 PM    [facility]

## 2020-06-08 ENCOUNTER — Encounter: Payer: Self-pay | Admitting: Cardiology

## 2020-06-08 ENCOUNTER — Ambulatory Visit (INDEPENDENT_AMBULATORY_CARE_PROVIDER_SITE_OTHER): Payer: Medicare HMO | Admitting: Cardiology

## 2020-06-08 ENCOUNTER — Other Ambulatory Visit: Payer: Self-pay

## 2020-06-08 VITALS — BP 138/76 | HR 50 | Ht 66.0 in | Wt 200.8 lb

## 2020-06-08 DIAGNOSIS — I712 Thoracic aortic aneurysm, without rupture: Secondary | ICD-10-CM | POA: Diagnosis not present

## 2020-06-08 DIAGNOSIS — R079 Chest pain, unspecified: Secondary | ICD-10-CM

## 2020-06-08 DIAGNOSIS — I700 Atherosclerosis of aorta: Secondary | ICD-10-CM | POA: Diagnosis not present

## 2020-06-08 DIAGNOSIS — I350 Nonrheumatic aortic (valve) stenosis: Secondary | ICD-10-CM

## 2020-06-08 DIAGNOSIS — I7121 Aneurysm of the ascending aorta, without rupture: Secondary | ICD-10-CM

## 2020-06-08 DIAGNOSIS — R001 Bradycardia, unspecified: Secondary | ICD-10-CM | POA: Diagnosis not present

## 2020-06-08 MED ORDER — PRAVASTATIN SODIUM 40 MG PO TABS: 40.0000 mg | ORAL_TABLET | Freq: Every day | ORAL | 3 refills | Status: DC

## 2020-06-08 NOTE — Patient Instructions (Signed)
Medication Instructions:  INCREASE pravastatin to 40 mg daily  *If you need a refill on your cardiac medications before your next appointment, please call your pharmacy*  Testing/Procedures: Your physician has requested that you have an echocardiogram. Echocardiography is a painless test that uses sound waves to create images of your heart. It provides your doctor with information about the size and shape of your heart and how well your hearts chambers and valves are working. This procedure takes approximately one hour. There are no restrictions for this procedure.  This will be done at our [facility] location:  [address]  Follow-Up: At [facility], you and your health needs are our priority.  As part of our continuing mission to provide you with exceptional heart care, we have created designated Provider Care Teams.  These Care Teams include your primary Cardiologist (physician) and Advanced Practice Providers (APPs -  Physician Assistants and Nurse Practitioners) who all work together to provide you with the care you need, when you need it.  We recommend signing up for the patient portal called "MyChart".  Sign up information is provided on this After Visit Summary.  MyChart is used to connect with patients for Virtual Visits (Telemedicine).  Patients are able to view lab/test results, encounter notes, upcoming appointments, etc.  Non-urgent messages can be sent to your provider as well.   To learn more about what you can do with MyChart, go to [email or web address].    Your next appointment:   12 month(s)  The format for your next appointment:   In Person  Provider:   Rollene Rotunda, MD

## 2020-06-27 ENCOUNTER — Other Ambulatory Visit: Payer: Self-pay

## 2020-06-27 ENCOUNTER — Ambulatory Visit (HOSPITAL_COMMUNITY): Payer: Medicare HMO | Attending: Cardiovascular Disease

## 2020-06-27 DIAGNOSIS — I350 Nonrheumatic aortic (valve) stenosis: Secondary | ICD-10-CM | POA: Diagnosis not present

## 2020-06-27 LAB — ECHOCARDIOGRAM COMPLETE
AR max vel: 1.62 cm2
AV Area VTI: 1.56 cm2
AV Area mean vel: 1.55 cm2
AV Mean grad: 20 mmHg
AV Peak grad: 32.9 mmHg
Ao pk vel: 2.87 m/s
Area-P 1/2: 4.96 cm2
S' Lateral: 3.3 cm

## 2020-07-04 ENCOUNTER — Telehealth: Payer: Self-pay | Admitting: *Deleted

## 2020-07-04 DIAGNOSIS — I77810 Thoracic aortic ectasia: Secondary | ICD-10-CM

## 2020-07-04 DIAGNOSIS — Z01812 Encounter for preprocedural laboratory examination: Secondary | ICD-10-CM

## 2020-07-04 NOTE — Telephone Encounter (Signed)
Advised patient, orders placed, and message to scheduling to arrange  

## 2020-07-04 NOTE — Telephone Encounter (Signed)
-----   Message from Rollene Rotunda, MD sent at 07/01/2020  4:06 PM EDT ----- AS is moderate.  No change in therapy or further testing for this.  However, the aorta appeared to be larger although this is not as easy to measure on echo. I had planned CT in 2022 but I would like him to have a CT of his ascending aorta this year to make sure it has not increased significantly in size from last year.  Call Jesse Hardy with the results and send results to Macy Mis, MD

## 2020-07-05 ENCOUNTER — Telehealth: Payer: Self-pay | Admitting: Cardiology

## 2020-07-05 NOTE — Telephone Encounter (Signed)
Spoke with patient regarding appointment for CTA chest/aorta scheduled Tuesday 07/18/20 at 3:20 pm at [facility]  [address]---arrival time is 3:00 pm for check in----Liquids only 4 hours prior to study---patient to come in next week (before Friday) for lab work.  He voiced his understanding

## 2020-07-13 LAB — BASIC METABOLIC PANEL
BUN/Creatinine Ratio: 16 (ref 10–24)
BUN: 17 mg/dL (ref 8–27)
CO2: 23 mmol/L (ref 20–29)
Calcium: 9.9 mg/dL (ref 8.6–10.2)
Chloride: 104 mmol/L (ref 96–106)
Creatinine, Ser: 1.04 mg/dL (ref 0.76–1.27)
GFR calc Af Amer: 84 mL/min/{1.73_m2} (ref >59)
GFR calc non Af Amer: 72 mL/min/{1.73_m2} (ref >59)
Glucose: 91 mg/dL (ref 65–99)
Potassium: 4.7 mmol/L (ref 3.5–5.2)
Sodium: 140 mmol/L (ref 134–144)

## 2020-07-18 ENCOUNTER — Ambulatory Visit
Admission: RE | Admit: 2020-07-18 | Discharge: 2020-07-18 | Disposition: A | Discharge status: Home / Self Care | Payer: Medicare HMO | Source: Ambulatory Visit | Attending: Cardiology | Admitting: Cardiology

## 2020-07-18 DIAGNOSIS — I77810 Thoracic aortic ectasia: Secondary | ICD-10-CM

## 2020-07-18 MED ORDER — IOPAMIDOL (ISOVUE-370) INJECTION 76%
75.0000 mL | Freq: Once | INTRAVENOUS | Status: AC | PRN
  Administered 2020-07-18: 75 mL via INTRAVENOUS

## 2020-07-24 ENCOUNTER — Other Ambulatory Visit: Payer: Self-pay | Admitting: *Deleted

## 2020-07-24 DIAGNOSIS — I77810 Thoracic aortic ectasia: Secondary | ICD-10-CM

## 2021-03-19 ENCOUNTER — Telehealth: Payer: Self-pay | Admitting: Cardiology

## 2021-03-19 NOTE — Telephone Encounter (Signed)
Pt c/o BP issue: STAT if pt c/o blurred vision, one-sided weakness or slurred speech  1. What are your last 5 BP readings? 132/93, 149/97, 188/100 today , 164/73 03-18-21, 162/89 03-17-21  2. Are you having any other symptoms (ex. Dizziness, headache, blurred vision, passed out)?  Mild headaches  3. What is your BP issue? Blood pressure is running high for, patient wants to be seen please

## 2021-03-19 NOTE — Telephone Encounter (Signed)
Spoke to patient he stated appox 3 months ago his urologist changed his medications due to urinary frequency.Stated since medication change his B/P has been going up.Stated he stopped Doxazosin and started him on Trospium Chloride 60 mg daily,Tadalafil 5 mg daily,Finasteride 5 mg daily.He is no longer taking Tamsulosin.Stated urinary frequency is better, but B/P is elevated at present 132/93.Last night 188/100.Last week 149/97,164/73,162/89.Pulse [id number].Advised Dr.Hochrein is out of office today.Appointment scheduled with Gillian Shields PA 7/22 at 11:00 am. at [facility] location.I will send message to Dr.Hochrein for advice.

## 2021-03-20 NOTE — Telephone Encounter (Signed)
Spoke to patient Dr.Hochrein's advice given. 

## 2021-03-23 ENCOUNTER — Ambulatory Visit (HOSPITAL_BASED_OUTPATIENT_CLINIC_OR_DEPARTMENT_OTHER): Payer: Medicare HMO | Admitting: Family

## 2021-03-29 NOTE — Progress Notes (Signed)
Office Visit    Patient Name: Jesse Hardy. Date of Encounter: 03/30/2021  PCP:  Macy Mis, MD   [facility]  Cardiologist:  Rollene Rotunda, MD  Advanced Practice Provider:  No care team member to display Electrophysiologist:  None    Chief Complaint    Jesse Beam Montez Hageman. is a 70 y.o. 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    Past Medical History    Past Medical History:  Diagnosis Date   Asthma    GERD (gastroesophageal reflux disease)    Hyperlipidemia    Hypertension    Past Surgical History:  Procedure Laterality Date   APPENDECTOMY  1972   BACK SURGERY  2018   LEFT HEART CATH AND CORONARY ANGIOGRAPHY N/A 06/09/2018   Procedure: LEFT HEART CATH AND CORONARY ANGIOGRAPHY;  Surgeon: [place], Peter M, MD;  Location: [facility] INVASIVE CV LAB;  Service: Cardiovascular;  Laterality: N/A;   ROTATOR CUFF REPAIR  2017   TONSILECTOMY, ADENOIDECTOMY, BILATERAL MYRINGOTOMY AND TUBES  1958   VASECTOMY  1985    Allergies  Allergies  Allergen Reactions   Penicillins Other (See Comments)    Unsure of allergy Has patient had a PCN reaction causing immediate rash, facial/tongue/throat swelling, SOB or lightheadedness with hypotension: Unknown Has patient had a PCN reaction causing severe rash involving mucus membranes or skin necrosis: Unknown Has patient had a PCN reaction that required hospitalization: Unknown Has patient had a PCN reaction occurring within the last 10 years: No If all of the above answers are "NO", then may proceed with Cephalosporin use.     History of Present Illness    Jesse Kraai. is a 70 y.o. 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 Antoine Poche.  He was evaluated for murmur with finding of  mild aortic stenosis by echo 2019. LHC 06/2018 with minor nonobstructive CAD.   He was last seen 06/08/20 doing well from a cardiac perspective. His Pravastatin was increased as LDL >70. Echo ordered for monitoring of aortic stenosis. Echo 06/2020 LVEF 55-60%, no RWMA, mild concentric LVH, gr2DD, RV normal size and function, mild dilation LA, trivial MR, bicuspid aortic valve with moderate calcification andmild to moderate stenosis, moderate dilation ascending aorta 64mm. He was recommended for CT performed 07/2020 showing stable ascending aorta dilation 4.2cm recommended for annual imaging.  He contacted the office 03/19/21 noting elevated blood pressure. He had seen urology due to urinary frequency and medications were adjutsed. Doxazosin was stopped and Trospium Chloride and Finasteride initiated.   He presents today for follow up. He brings a detailed log of his blood pressure readings when checked with wrist cuff. He has not had this cuff checked for accuracy previous.  We discussed that wrist cuffs can also provide higher readings that are accurate. He takes his Finasteride in the morning and  Losartan in the evening.  Notes no lightheadedness, dizziness, near-syncope nor syncope.  He does note occasional headache which he attributes to high blood pressure which resolves with rest.  He does not take Tylenol or ibuprofen.  Notes periodic chest pain across his chest that occurs at rest when laying down.  It will start on the right side and radiate across to the left.  Follows with a "weird sensation" in his left chest.  He and his wife walk 2 miles per day for exercise. Reports no chest pain with exertion nor chest pain.  We discussed that the symptoms were atypical for angina.  Drinks 2 cups of coffee in the morning then water throughout the day. He endorses following a heart healthy diet.   EKGs/Labs/Other Studies Reviewed:   The following studies were reviewed today:  Echo 06/27/20  1. Left  ventricular ejection fraction, by estimation, is 55 to 60%. The  left ventricle has normal function. The left ventricle has no regional  wall motion abnormalities. There is mild concentric left ventricular  hypertrophy. Left ventricular diastolic  parameters are consistent with Grade II diastolic dysfunction  (pseudonormalization). The average left ventricular global longitudinal  strain is -25.2 %. The global longitudinal strain is normal.   2. Right ventricular systolic function is normal. The right ventricular  size is normal.   3. Left atrial size was mildly dilated.   4. The mitral valve is normal in structure. Trivial mitral valve  regurgitation. No evidence of mitral stenosis.   5. 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 Compared with the ehco  05/2018, mean aortic valve gradient has incrased from 12 mmHg to 20 mmHg.  The aortic valve is bicuspid. There is moderate calcification of the  aortic valve. There is moderate  thickening of the aortic valve. Aortic valve regurgitation is not  visualized. Mild to moderate aortic valve stenosis. Aortic valve area, by  VTI measures 1.56 cm. Aortic valve mean gradient measures 20.0 mmHg.  Aortic valve Vmax measures 2.87 m/s.   6. Aortic dilatation noted. There is moderate dilatation of the ascending  aorta, measuring 46 mm.   7. The inferior vena cava is dilated in size with >50% respiratory  variability, suggesting right atrial pressure of 8 mmHg.  1. Left ventricular ejection fraction, by estimation, is 55 to 60%. The  left ventricle has normal function. The left ventricle has no regional  wall motion abnormalities. There is mild concentric left ventricular  hypertrophy. Left ventricular diastolic  parameters are consistent with Grade II diastolic dysfunction  (pseudonormalization). The average left ventricular global longitudinal  strain is -25.2 %. The global longitudinal strain is normal.   2. Right ventricular systolic function is  normal. The right ventricular  size is normal.   3. Left atrial size was mildly dilated.   4. The mitral valve is normal in structure. Trivial mitral valve  regurgitation. No evidence of mitral stenosis.   5. 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 Compared with the ehco  05/2018, mean aortic valve gradient has incrased from 12 mmHg to 20 mmHg.  The aortic valve is bicuspid. There is moderate calcification of the  aortic valve. There is moderate  thickening of the aortic valve. Aortic valve regurgitation is not  visualized. Mild to moderate aortic valve stenosis. Aortic valve area, by  VTI measures 1.56 cm. Aortic valve mean gradient measures 20.0 mmHg.  Aortic valve Vmax measures 2.87 m/s.   6. Aortic dilatation noted. There is moderate dilatation of the ascending  aorta, measuring 46 mm.   7. The inferior vena cava is dilated in size with >50% respiratory  variability, suggesting right atrial pressure of 8 mmHg.   CT angio aorta 07/2020  IMPRESSION: Unchanged diameter of the ascending aorta, estimated current CT 4.2 cm. Recommend annual imaging followup by CTA or MRA. This recommendation follows 2010 ACCF/AHA/AATS/ACR/ASA/SCA/SCAI/SIR/STS/SVM Guidelines for the Diagnosis and Management of Patients with Thoracic Aortic Disease. Circulation.  2010; 121: Q008-Q761. Aortic aneurysm NOS (ICD10-I71.9)   Aortic Atherosclerosis (ICD10-I70.0).  EKG:  EKG is  ordered today.  The ekg ordered today demonstrates NSR 60 bpm with left axis deviation and no acute ST/T wave changes.   Recent Labs: 07/12/2020: BUN 17; Creatinine, Ser 1.04; Potassium 4.7; Sodium 140  Recent Lipid Panel No results found for: CHOL, TRIG, HDL, CHOLHDL, VLDL, LDLCALC, LDLDIRECT  Home Medications   Current Meds  Medication Sig   ammonium lactate (LAC-HYDRIN) 12 % lotion Apply 1 application topically as needed for dry skin.    aspirin 81 MG tablet Take 81 mg by mouth daily.    Cholecalciferol 125 MCG (5000 UT)  TABS Take 5,000 Units by mouth daily.   clotrimazole (LOTRIMIN) 1 % cream Apply 1 application topically 2 (two) times daily as needed (irritation).    finasteride (PROSCAR) 5 MG tablet Take 1 tablet (5 mg total) by mouth daily.   losartan (COZAAR) 25 MG tablet Take 25 mg by mouth at bedtime.   montelukast (SINGULAIR) 10 MG tablet Take 10 mg by mouth at bedtime.   Multiple Vitamin (MULTIVITAMIN) tablet Take 1 tablet by mouth daily.   Omega-3 Fatty Acids (FISH OIL) 1200 MG CAPS Take 2,400 mg by mouth daily.   omeprazole (PRILOSEC) 20 MG capsule Take 20 mg by mouth daily.    pravastatin (PRAVACHOL) 40 MG tablet Take 1 tablet (40 mg total) by mouth daily.   tadalafil (CIALIS) 5 MG tablet Take 1 tablet (5 mg total) by mouth daily as needed for erectile dysfunction.   triamcinolone cream (KENALOG) 0.1 % Apply 1 application topically 2 (two) times daily as needed (dryness, skin spots).    Trospium Chloride 60 MG CP24 Take 60 mg daily   TURMERIC PO Take 1,000 mg by mouth daily.     Review of Systems      All other systems reviewed and are otherwise negative except as noted above.  Physical Exam    VS:  BP 120/74   Pulse 60   Ht 5\' 6"  (1.676 m)   Wt 209 lb (94.8 kg)   BMI 33.73 kg/m  , BMI Body mass index is 33.73 kg/m.  Wt Readings from Last 3 Encounters:  03/30/21 209 lb (94.8 kg)  06/08/20 200 lb 12.8 oz (91.1 kg)  06/08/19 210 lb 12.8 oz (95.6 kg)     GEN: Well nourished, well developed, in no acute distress. HEENT: normal. Neck: Supple, no JVD, carotid bruits, or masses. Cardiac: RRR, no  rubs, or gallops. Gr 2/6 systolic murmur. No clubbing, cyanosis, edema.  Radials/PT 2+ and equal bilaterally.  Respiratory:  Respirations regular and unlabored, clear to auscultation bilaterally. GI: Soft, nontender, nondistended. MS: No deformity or atrophy. Skin: Warm and dry, no rash. Neuro:  Strength and sensation are intact. Psych: Normal affect.  Assessment & Plan    Ascending aorta  dilation - Due for repeat CT 07/2021.  Ordered today.  Continue optimal BP control.  Recommend avoidance of fluoroquinolones.  Bradycardia - Asymptomatic with no lightheadedness, dizziness, near-syncope, syncope.  Not on AV nodal blocking agent.  EKG today sinus rhythm 60 bpm.  Mild to moderate aortic stenosis by echo 06/2020 -No shortness of breath nor lightheadedness.  Murmur stable on exam.  Continue optimal blood pressure control.  No indication for repeat echocardiogram at that time.  Nonobstructive CAD / Aortic atherosclerosis -GDMT includes aspirin, statin.  No beta-blocker due to baseline bradycardia.  He reports atypical chest pain that occurs at rest and  radiates from right to left chest.  EKG today with no acute ST/T wave changes.  He walks 2 miles daily for exercise without dyspnea chest pain.  Anticipate his chest pain is musculoskeletal.  No indication for ischemic evaluation at this time.  HLD, LDL goal <70 -continue pravastatin 40 mg daily.  He is due for repeat lipids to ensure LDL goal less than 70.  He has upcoming annual visit with his primary care provider next month where these will be collected.  If LDL not at goal of less than 70 plan to start Zetia 10 mg daily versus transition to rosuvastatin.  HTN - concern regarding home blood pressure readings  though I anticipate his wrist cuff not accurate.  He is agreeable to purchase an arm cuff.  His blood pressure in clinic today was 120/74 and 128/68.  Continue losartan 25 mg daily.  If blood pressures consistently greater than 130/80 on home monitoring consider increased dose.  Disposition: Follow up in November after CT aorta  with Dr. Antoine PocheHochrein   Signed, Alver Sorrowaitlin S Deaysia Grigoryan, NP 03/30/2021, 11:21 AM [facility]

## 2021-03-30 ENCOUNTER — Other Ambulatory Visit: Payer: Self-pay | Admitting: Cardiology

## 2021-03-30 ENCOUNTER — Ambulatory Visit (HOSPITAL_BASED_OUTPATIENT_CLINIC_OR_DEPARTMENT_OTHER): Payer: Medicare HMO | Admitting: Family

## 2021-03-30 ENCOUNTER — Other Ambulatory Visit: Payer: Self-pay

## 2021-03-30 ENCOUNTER — Other Ambulatory Visit (HOSPITAL_BASED_OUTPATIENT_CLINIC_OR_DEPARTMENT_OTHER): Payer: Self-pay | Admitting: *Deleted

## 2021-03-30 ENCOUNTER — Encounter (HOSPITAL_BASED_OUTPATIENT_CLINIC_OR_DEPARTMENT_OTHER): Payer: Self-pay | Admitting: Family

## 2021-03-30 VITALS — BP 120/74 | HR 60 | Ht 66.0 in | Wt 209.0 lb

## 2021-03-30 DIAGNOSIS — I77810 Thoracic aortic ectasia: Secondary | ICD-10-CM | POA: Diagnosis not present

## 2021-03-30 DIAGNOSIS — I350 Nonrheumatic aortic (valve) stenosis: Secondary | ICD-10-CM | POA: Diagnosis not present

## 2021-03-30 DIAGNOSIS — R001 Bradycardia, unspecified: Secondary | ICD-10-CM | POA: Diagnosis not present

## 2021-03-30 DIAGNOSIS — Z79899 Other long term (current) drug therapy: Secondary | ICD-10-CM

## 2021-03-30 DIAGNOSIS — E782 Mixed hyperlipidemia: Secondary | ICD-10-CM | POA: Diagnosis not present

## 2021-03-30 DIAGNOSIS — I10 Essential (primary) hypertension: Secondary | ICD-10-CM

## 2021-03-30 NOTE — Patient Instructions (Addendum)
Medication Instructions:  Continue your current medications.   If your blood pressure is consistently high even after using the new blood pressure cuff, we may consider changing your dose of Losartan.  *If you need a refill on your cardiac medications before your next appointment, please call your pharmacy*   Lab Work: Recommend having your cholesterol levels checked at your upcoming office visit with Dr. Earnest Bailey.  If you have labs (blood work) drawn today and your tests are completely normal, you will receive your results only by: MyChart Message (if you have MyChart) OR A paper copy in the mail If you have any lab test that is abnormal or we need to change your treatment, we will call you to review the results.   Testing/Procedures: Your EKG today showed normal sinus rhythm which is a good result!  You are due for a CT of your aorta for monitoring of your ascending dilation in November 2023.   Follow-Up: At [facility], you and your health needs are our priority.  As part of our continuing mission to provide you with exceptional heart care, we have created designated Provider Care Teams.  These Care Teams include your primary Cardiologist (physician) and Advanced Practice Providers (APPs -  Physician Assistants and Nurse Practitioners) who all work together to provide you with the care you need, when you need it.  We recommend signing up for the patient portal called "MyChart".  Sign up information is provided on this After Visit Summary.  MyChart is used to connect with patients for Virtual Visits (Telemedicine).  Patients are able to view lab/test results, encounter notes, upcoming appointments, etc.  Non-urgent messages can be sent to your provider as well.   To learn more about what you can do with MyChart, go to [email or web address].    Your next appointment:   In November after your CT  The format for your next appointment:   In Person  Provider:   You may see Rollene Rotunda, MD or one of the following Advanced Practice Providers on your designated Care Team:   Theodore Demark, PA-C Juanda Crumble, PA-C Joni Reining, DNP, ANP  Other Instructions  Omron is a good brand of blood pressure cuff. Recommend purchasing an arm cuff as they are more accurate. Contact our office if your blood pressure is consistently more than 130 for the top number.  Tips to Measure your Blood Pressure Correctly  To determine whether you have hypertension, a medical professional will take a blood pressure reading. How you prepare for the test, the position of your arm, and other factors can change a blood pressure reading by 10% or more. That could be enough to hide high blood pressure, start you on a drug you don't really need, or lead your doctor to incorrectly adjust your medications.  National and international guidelines offer specific instructions for measuring blood pressure. If a doctor, nurse, or medical assistant isn't doing it right, don't hesitate to ask him or her to get with the guidelines.  Here's what you can do to ensure a correct reading:  Don't drink a caffeinated beverage or smoke during the 30 minutes before the test.  Sit quietly for five minutes before the test begins.  During the measurement, sit in a chair with your feet on the floor and your arm supported so your elbow is at about heart level.  The inflatable part of the cuff should completely cover at least 80% of your upper arm, and the cuff should be placed  on bare skin, not over a shirt.  Don't talk during the measurement.  Have your blood pressure measured twice, with a brief break in between. If the readings are different by 5 points or more, have it done a third time.  In 2017, new guidelines from the American Heart Association, the [employer] of Cardiology, and nine other health organizations lowered the diagnosis of high blood pressure to 130/80 mm Hg or higher for all adults. The  guidelines also redefined the various blood pressure categories to now include normal, elevated, Stage 1 hypertension, Stage 2 hypertension, and hypertensive crisis (see "Blood pressure categories").  Blood pressure categories  Blood pressure category SYSTOLIC (upper number)  DIASTOLIC (lower number)  Normal Less than 120 mm Hg and Less than 80 mm Hg  Elevated 120-129 mm Hg and Less than 80 mm Hg  High blood pressure: Stage 1 hypertension 130-139 mm Hg or 80-89 mm Hg  High blood pressure: Stage 2 hypertension 140 mm Hg or higher or 90 mm Hg or higher  Hypertensive crisis (consult your doctor immediately) Higher than 180 mm Hg and/or Higher than 120 mm Hg  Source: American Heart Association and American Stroke Association. For more on getting your blood pressure under control, buy Controlling Your Blood Pressure, a Special Health Report from [facility]   Blood Pressure Log   Date   Time  Blood Pressure  Position  Example: Nov 1 9 AM 124/78 sitting                                                        Heart Healthy Diet Recommendations: A low-salt diet is recommended. Meats should be grilled, baked, or boiled. Avoid fried foods. Focus on lean protein sources like fish or chicken with vegetables and fruits. The American Heart Association is a Chief Technology Officer!  American Heart Association Diet and Lifeystyle Recommendations   Exercise recommendations: The American Heart Association recommends 150 minutes of moderate intensity exercise weekly. Try 30 minutes of moderate intensity exercise 4-5 times per week. This could include walking, jogging, or swimming.

## 2021-06-03 ENCOUNTER — Other Ambulatory Visit: Payer: Self-pay | Admitting: Cardiology

## 2021-07-05 ENCOUNTER — Other Ambulatory Visit: Payer: Self-pay

## 2021-07-05 DIAGNOSIS — Z79899 Other long term (current) drug therapy: Secondary | ICD-10-CM

## 2021-07-06 LAB — BASIC METABOLIC PANEL
BUN/Creatinine Ratio: 16 (ref 10–24)
BUN: 16 mg/dL (ref 8–27)
CO2: 25 mmol/L (ref 20–29)
Calcium: 10.2 mg/dL (ref 8.6–10.2)
Chloride: 104 mmol/L (ref 96–106)
Creatinine, Ser: 1 mg/dL (ref 0.76–1.27)
Glucose: 80 mg/dL (ref 70–99)
Potassium: 4.9 mmol/L (ref 3.5–5.2)
Sodium: 141 mmol/L (ref 134–144)
eGFR: 80 mL/min/{1.73_m2} (ref >59)

## 2021-07-12 ENCOUNTER — Ambulatory Visit
Admission: RE | Admit: 2021-07-12 | Discharge: 2021-07-12 | Disposition: A | Discharge status: Home / Self Care | Payer: Medicare HMO | Source: Ambulatory Visit | Attending: Cardiology | Admitting: Cardiology

## 2021-07-12 DIAGNOSIS — I77810 Thoracic aortic ectasia: Secondary | ICD-10-CM

## 2021-07-12 MED ORDER — IOPAMIDOL (ISOVUE-370) INJECTION 76%
75.0000 mL | Freq: Once | INTRAVENOUS | Status: AC | PRN
  Administered 2021-07-12: 75 mL via INTRAVENOUS

## 2021-07-26 DIAGNOSIS — E785 Hyperlipidemia, unspecified: Secondary | ICD-10-CM | POA: Insufficient documentation

## 2021-07-26 NOTE — Progress Notes (Signed)
He is not    Cardiology Office Note   Date:  07/27/2021   ID:  Jesse Curt., DOB [birth year], MRN [id number]  PCP:  Macy Mis, MD  Cardiologist:   Rollene Rotunda, MD Referring:  Macy Mis, MD   Chief Complaint  Patient presents with   Chest Pain       History of Present Illness: Jesse Schoon. is a 70 y.o. male who presents for follow up of a murmur.  He had bradycardia. He did have chest pain and had an abnormal stress test.   He subsequently had a cardiac cath which demonstrated non obstructive disease.   Echo demonstrated mild AS.   He does very well.  He stays active.  He walks and he does a lot of yard work with raking. The patient denies any new symptoms such as neck or arm discomfort. There has been no new shortness of breath, PND or orthopnea. There have been no reported palpitations, presyncope or syncope.   He has had some fleeting upper chest discomfort that he is sporadic, mild and not associated with any physical activity.    Past Medical History:  Diagnosis Date   Asthma    GERD (gastroesophageal reflux disease)    Hyperlipidemia    Hypertension    Past Surgical History:  Procedure Laterality Date   APPENDECTOMY  1972   BACK SURGERY  2018   LEFT HEART CATH AND CORONARY ANGIOGRAPHY N/A 06/09/2018   Procedure: LEFT HEART CATH AND CORONARY ANGIOGRAPHY;  Surgeon: Swaziland, Peter M, MD;  Location: [facility] INVASIVE CV LAB;  Service: Cardiovascular;  Laterality: N/A;   ROTATOR CUFF REPAIR  2017   TONSILECTOMY, ADENOIDECTOMY, BILATERAL MYRINGOTOMY AND TUBES  1958   VASECTOMY  1985    Current Outpatient Medications  Medication Sig Dispense Refill   ammonium lactate (LAC-HYDRIN) 12 % lotion Apply 1 application topically as needed for dry skin.      aspirin 81 MG tablet Take 81 mg by mouth daily.      Cholecalciferol 125 MCG (5000 UT) TABS Take 5,000 Units by mouth daily.     clotrimazole (LOTRIMIN) 1 % cream Apply 1 application topically 2 (two) times  daily as needed (irritation).      finasteride (PROSCAR) 5 MG tablet Take 1 tablet (5 mg total) by mouth daily.     Fluticasone-Salmeterol (ADVAIR) 100-50 MCG/DOSE AEPB Inhale 1 puff into the lungs 2 (two) times daily as needed (shortness of breath).     hydrocortisone 2.5 % cream 1 application to affected area     losartan (COZAAR) 25 MG tablet Take 25 mg by mouth at bedtime.     montelukast (SINGULAIR) 10 MG tablet Take 10 mg by mouth at bedtime.     montelukast (SINGULAIR) 10 MG tablet 1 tablet     Multiple Vitamin (MULTIVITAMIN) tablet Take 1 tablet by mouth daily.     Omega-3 Fatty Acids (FISH OIL) 1200 MG CAPS Take 2,400 mg by mouth daily.     omeprazole (PRILOSEC) 20 MG capsule Take 20 mg by mouth daily.      pravastatin (PRAVACHOL) 80 MG tablet Take 1 tablet (80 mg total) by mouth every evening. 90 tablet 3   tadalafil (CIALIS) 5 MG tablet Take 1 tablet (5 mg total) by mouth daily as needed for erectile dysfunction. 10 tablet 0   tamsulosin (FLOMAX) 0.4 MG CAPS capsule 1 capsule     triamcinolone cream (KENALOG) 0.1 % Apply 1 application topically  2 (two) times daily as needed (dryness, skin spots).   1   Trospium Chloride 60 MG CP24 Take 60 mg daily 30 capsule    TURMERIC PO Take 1,000 mg by mouth daily.     No current facility-administered medications for this visit.    Allergies:   Penicillins   ROS:  Please see the history of present illness.   Otherwise, review of systems are positive for none.   All other systems are reviewed and negative.    PHYSICAL EXAM: VS:  BP 138/82   Pulse (!) 48   Ht 5\' 6"  (1.676 m)   Wt 210 lb 6.4 oz (95.4 kg)   SpO2 96%   BMI 33.96 kg/m  , BMI Body mass index is 33.96 kg/m. GENERAL:  Well appearing NECK:  No jugular venous distention, waveform within normal limits, carotid upstroke brisk and symmetric, no bruits, no thyromegaly LUNGS:  Clear to auscultation bilaterally CHEST:  Unremarkable HEART:  PMI not displaced or sustained,S1 and S2  within normal limits, no S3, no S4, no clicks, no rubs, 2 out of 6 apical systolic murmur brief and radiating slightly at the aortic outflow tract, no diastolic murmurs ABD:  Flat, positive bowel sounds normal in frequency in pitch, no bruits, no rebound, no guarding, no midline pulsatile mass, no hepatomegaly, no splenomegaly EXT:  2 plus pulses throughout, no edema, no cyanosis no clubbing   EKG:  EKG is not ordered today. NA  07/05/2021: BUN 16; Creatinine, Ser 1.00; Potassium 4.9; Sodium 141    Lipid Panel No results found for: CHOL, TRIG, HDL, CHOLHDL, VLDL, LDLCALC, LDLDIRECT    Wt Readings from Last 3 Encounters:  07/27/21 210 lb 6.4 oz (95.4 kg)  03/30/21 209 lb (94.8 kg)  06/08/20 200 lb 12.8 oz (91.1 kg)      Other studies Reviewed: Additional studies/ records that were reviewed today include: Labs Review of the above records demonstrates: See elsewhere   ASSESSMENT AND PLAN:  BRADYCARDIA:   He tolerates this and has no presyncope or syncope.  No change in therapy.   AS:   This was moderate.  There was mild AS in 2021.  I will follow this up with an echocardiogram probably in 2024.  CHEST PAIN:    He has no anginal chest pain and had nonobstructive disease.  No change in therapy.   AORTIC ATHEROSCLEROSIS.   He will continue with aggressive risk reduction.  ASCENDING AORTIC ANEURYSM:   This was 42  mm earlier this month and I will follow-up a CT again in 2 years.  DYSLIPIDEMIA: Patient's LDL 92 and I will increase his pravastatin to 80 mg daily and repeat a lipid profile in 10 weeks.   Current medicines are reviewed at length with the patient today.  The patient does not have concerns regarding medicines.  The following changes have been made: As above  Labs/ tests ordered today include:   Orders Placed This Encounter  Procedures   Lipid panel      Disposition:   FU with me 1 year   Signed, 2025, MD  07/27/2021 11:52 AM    [facility]

## 2021-07-27 ENCOUNTER — Ambulatory Visit: Payer: Medicare HMO | Admitting: Cardiology

## 2021-07-27 ENCOUNTER — Other Ambulatory Visit: Payer: Self-pay

## 2021-07-27 ENCOUNTER — Encounter: Payer: Self-pay | Admitting: Cardiology

## 2021-07-27 VITALS — BP 138/82 | HR 48 | Ht 66.0 in | Wt 210.4 lb

## 2021-07-27 DIAGNOSIS — R072 Precordial pain: Secondary | ICD-10-CM

## 2021-07-27 DIAGNOSIS — R001 Bradycardia, unspecified: Secondary | ICD-10-CM

## 2021-07-27 DIAGNOSIS — I77810 Thoracic aortic ectasia: Secondary | ICD-10-CM

## 2021-07-27 DIAGNOSIS — I700 Atherosclerosis of aorta: Secondary | ICD-10-CM | POA: Diagnosis not present

## 2021-07-27 DIAGNOSIS — E785 Hyperlipidemia, unspecified: Secondary | ICD-10-CM

## 2021-07-27 MED ORDER — PRAVASTATIN SODIUM 80 MG PO TABS: 80.0000 mg | ORAL_TABLET | Freq: Every evening | ORAL | 3 refills | Status: DC

## 2021-07-27 NOTE — Patient Instructions (Addendum)
Medication Instructions:  Increase Pravastatin to 80 mg daily Continue all other medications *If you need a refill on your cardiac medications before your next appointment, please call your pharmacy*   Lab Work: Lipid Panel 10 weeks   Testing/Procedures: None ordered   Follow-Up: At [facility], you and your health needs are our priority.  As part of our continuing mission to provide you with exceptional heart care, we have created designated Provider Care Teams.  These Care Teams include your primary Cardiologist (physician) and Advanced Practice Providers (APPs -  Physician Assistants and Nurse Practitioners) who all work together to provide you with the care you need, when you need it.  We recommend signing up for the patient portal called "MyChart".  Sign up information is provided on this After Visit Summary.  MyChart is used to connect with patients for Virtual Visits (Telemedicine).  Patients are able to view lab/test results, encounter notes, upcoming appointments, etc.  Non-urgent messages can be sent to your provider as well.   To learn more about what you can do with MyChart, go to [email or web address].     Your next appointment: 1 year     The format for your next appointment: Office   Provider:  Dr.Hochrein

## 2021-10-10 LAB — LIPID PANEL
Chol/HDL Ratio: 2.7 ratio (ref 0.0–5.0)
Cholesterol, Total: 148 mg/dL (ref 100–199)
HDL: 55 mg/dL (ref >39)
LDL Chol Calc (NIH): 79 mg/dL (ref 0–99)
Triglycerides: 74 mg/dL (ref 0–149)
VLDL Cholesterol Cal: 14 mg/dL (ref 5–40)

## 2021-11-13 ENCOUNTER — Telehealth: Payer: Self-pay | Admitting: Cardiology

## 2021-11-13 NOTE — Telephone Encounter (Signed)
Pt c/o of Chest Pain: STAT if CP now or developed within 24 hours ? ?1. Are you having CP right now? no ? ?2. Are you experiencing any other symptoms (ex. SOB, nausea, vomiting, sweating)? SOB when moving around not now, left arm pain, fatigue ? ?3. How long have you been experiencing CP? couple days ? ?4. Is your CP continuous or coming and going? Comes and goes ? ?5. Have you taken Nitroglycerin? no ??  ? ?Patient states he has been having chest pain for a couple days. He says it is in the middle of his chest and also happens behind his sternum. He says he also gets left arm pain. He says he has been getting some SOB when moving around and also does not have as much energy. He says the pain is not a huge pain, but feels like a pressure.  ? ?

## 2021-11-13 NOTE — Telephone Encounter (Signed)
Returned the call to the patient. He stated that he has been having intermittent chest pain. He denied current chest pain today. He stated that the chest pain is intermittent and happens mainly when he is at rest. He stated that the pain will start mid chest and move towards his left arm. He stated that the pain last a few minutes.  ? ?He stated that he has also been having shortness of breath on exertion at times. This happens occasionally. He denies swelling. ? ?He is concerned due to all three of his other brothers having to have open heat surgery. The patient had a cardiac cath on 2019. ? ?An appointment has been made with DOD on 11/15/20. Patient made aware of ED precautions should new or worsening symptoms occur. Patient verbalized understanding.  ?  ? ? ?

## 2021-11-15 ENCOUNTER — Other Ambulatory Visit: Payer: Self-pay

## 2021-11-15 ENCOUNTER — Ambulatory Visit: Payer: PPO | Admitting: Internal Medicine

## 2021-11-15 ENCOUNTER — Encounter: Payer: Self-pay | Admitting: Internal Medicine

## 2021-11-15 VITALS — BP 149/86 | HR 47 | Ht 66.0 in | Wt 211.0 lb

## 2021-11-15 DIAGNOSIS — R0602 Shortness of breath: Secondary | ICD-10-CM | POA: Diagnosis not present

## 2021-11-15 DIAGNOSIS — Q231 Congenital insufficiency of aortic valve: Secondary | ICD-10-CM

## 2021-11-15 NOTE — Patient Instructions (Signed)
Medication Instructions:  ?No Changes In Medications at this time.  ?*If you need a refill on your cardiac medications before your next appointment, please call your pharmacy* ? ?Testing/Procedures: ?Your physician has requested that you have an echocardiogram. Echocardiography is a painless test that uses sound waves to create images of your heart. It provides your doctor with information about the size and shape of your heart and how well your heart?s chambers and valves are working. You may receive an ultrasound enhancing agent through an IV if needed to better visualize your heart during the echo.This procedure takes approximately one hour. There are no restrictions for this procedure. This will take place at the [address].  ? ?Your physician has requested that you have en exercise stress myoview. For further information please visit [email or web address] Please follow instruction sheet, as given.  ?This will take place at [address] ? ?The test will take approximately 3 to 4 hours to complete; you may bring reading material.  If someone comes with you to your appointment, they will need to remain in the main lobby due to limited space in the testing area.  ?  ?How to prepare for your Myocardial Perfusion Test: ?Do not eat or drink 3 hours prior to your test, except you may have water. ?Do not consume products containing caffeine (regular or decaffeinated) 12 hours prior to your test. (ex: coffee, chocolate, sodas, tea). ?Do wear comfortable clothes (no dresses or overalls) and walking shoes, tennis shoes preferred (No heels or open toe shoes are allowed). ?Do NOT wear cologne, perfume, aftershave, or lotions (deodorant is allowed). ?If you use an inhaler, use it the AM of your test and bring it with you.  ?If you use a nebulizer, use it the AM of your test.  ?If these instructions are not followed, your test will have to be rescheduled. ? ?Follow-Up: ?At [facility], you  and your health needs are our priority.  As part of our continuing mission to provide you with exceptional heart care, we have created designated Provider Care Teams.  These Care Teams include your primary Cardiologist (physician) and Advanced Practice Providers (APPs -  Physician Assistants and Nurse Practitioners) who all work together to provide you with the care you need, when you need it. ? ?We recommend signing up for the patient portal called "MyChart".  Sign up information is provided on this After Visit Summary.  MyChart is used to connect with patients for Virtual Visits (Telemedicine).  Patients are able to view lab/test results, encounter notes, upcoming appointments, etc.  Non-urgent messages can be sent to your provider as well.   ?To learn more about what you can do with MyChart, go to [email or web address].   ? ?Your next appointment:   ?3 month(s) ? ?The format for your next appointment:   ?In Person ? ?Provider:   ?Rollene Rotunda, MD   ? ?

## 2021-11-15 NOTE — Progress Notes (Signed)
?Cardiology Office Note:   ? ?Date:  11/15/2021  ? ?ID:  Jesse Curt., DOB 1951-05-28, MRN [id number] ? ?PCP:  Macy Mis, MD ?  ?[facility] Providers ?Cardiologist:  Rollene Rotunda, MD    ? ?Referring MD: Macy Mis, MD  ? ?No chief complaint on file. ?Dyspnea ? ?History of Present Illness:   ? ?Jesse Curt. is a 71 y.o. male with a hx of asthma , HTN, BAV moderate AS, here for an acute visit for subacute chest pain and shortness of breath ? ?He's been having chest pain in his chest and radiation down his arm. He had mild chest pressure in bed. Lasted a few minutes. He gets a more short of breath with stairs. This is not typical for him. The onset was in the past month or so. It is escalating.  He denies syncope. If he is working in the yard for a long period of time he can get LH; otherwise no persistent LH. No syncope. His brother has seivers 1 BAV, severe AS s/p recent valve replacement at [facility]. He follows with Dr. Antoine Poche  ? 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 ?Past Medical History:  ?Diagnosis Date  ? Asthma   ? GERD (gastroesophageal reflux disease)   ? Hyperlipidemia   ? Hypertension   ? ? ?Past Surgical History:  ?Procedure Laterality Date  ? APPENDECTOMY  1972  ? BACK SURGERY  2018  ? LEFT HEART CATH AND CORONARY ANGIOGRAPHY N/A 06/09/2018  ? Procedure: LEFT HEART CATH AND CORONARY ANGIOGRAPHY;  Surgeon: Swaziland, Peter M, MD;  Location: [facility] INVASIVE CV LAB;  Service: Cardiovascular;  Laterality: N/A;  ? ROTATOR CUFF REPAIR  2017  ? TONSILECTOMY, ADENOIDECTOMY, BILATERAL MYRINGOTOMY AND TUBES  1958  ? VASECTOMY  1985  ? ? ?Current Medications: ?No outpatient medications have been marked as taking for the 11/15/21 encounter (Appointment) with Maisie Fus, MD.  ?  ? ?Allergies:   Penicillins  ? ?Social History   ? ?Socioeconomic History  ? Marital status: Single  ?  Spouse name: Not on file  ? Number of children: Not on file  ? Years of education: Not on file  ? Highest education level: Not on file  ?Occupational History  ? Not on file  ?Tobacco Use  ? Smoking status: Never  ? Smokeless tobacco: Never  ?Substance and Sexual Activity  ? Alcohol use: Never  ? Drug use: Never  ? Sexual activity: Not on file  ?Other Topics Concern  ? Not on file  ?Social History Narrative  ? Lives with wife.  Retired.   ? ?Social Determinants of Health  ? ?Financial Resource Strain: Not on file  ?Food Insecurity: Not on file  ?Transportation Needs: Not on file  ?Physical Activity: Not on file  ?Stress: Not on file  ?Social Connections: Not on file  ?  ? ?Family History: ?The patient's family history includes Colon cancer in his father; Heart attack in his father and mother; Heart attack (age of onset: 2) in his brother; Heart disease in his brother; Stroke in his father. ? ?ROS:   ?Please see the history of present illness.    ? All other systems reviewed and are negative. ? ?EKGs/Labs/Other Studies Reviewed:   ? ?The following studies were reviewed today: ? ? ?  EKG:  EKG is  ordered today.  The ekg ordered today demonstrates  ? ?NSR, LAFB ? ?Recent Labs: ?07/05/2021: BUN 16; Creatinine, Ser 1.00; Potassium 4.9; Sodium 141  ?Recent Lipid Panel ?   ?Component Value Date/Time  ? CHOL 148 10/10/2021 1141  ? TRIG 74 10/10/2021 1141  ? HDL 55 10/10/2021 1141  ? CHOLHDL 2.7 10/10/2021 1141  ? LDLCALC 79 10/10/2021 1141  ? ? ? ?Risk Assessment/Calculations:   ?  ? ?    ? ?Physical Exam:   ? ?VS:  ? ?Vitals:  ? 11/15/21 1531  ?BP: (!) 149/86  ?Pulse: (!) 47  ?SpO2: 95%  ? ? ? ?Wt Readings from Last 3 Encounters:  ?07/27/21 210 lb 6.4 oz (95.4 kg)  ?03/30/21 209 lb (94.8 kg)  ?06/08/20 200 lb 12.8 oz (91.1 kg)  ?  ? ?GEN:  Well nourished, well developed in no acute distress ?HEENT: Normal ?NECK: No JVD; No carotid bruits ?LYMPHATICS: No  lymphadenopathy ?CARDIAC: RRR, 3/6 SEM murmurs, rubs, gallops ?RESPIRATORY:  Clear to auscultation without rales, wheezing or rhonchi  ?ABDOMEN: Soft, non-tender, non-distended ?MUSCULOSKELETAL:  No edema; No deformity  ?SKIN: Warm and dry ?NEUROLOGIC:  Alert and oriented x 3 ?PSYCHIATRIC:  Normal affect  ? ?ASSESSMENT:   ? ?#SOB/CP- Ddx includes worsening AS gradient v.angina. He has  risk for CVD including age. His shortness of breath correlates with activity. Will plan for repeat stress to ensure he does not have ischemic dx. He has mild chest pain at rest, I told him that if it lasts longer than just a few minutes to call EMS. SOB could be in the setting of progressive AS gradient. He has hx of BAV with moderate AS; last echo 2021.  Will plan for repeat echo to assess his gradient. If severe, then would plan for referral to structural heart team to assess for valve replacement. Ascending aorta < 5.0 cm; would only need valve replacement. Will notify his primary cardiologist with the results with plans to follow up with him. ? ? ?PLAN:   ? ?In order of problems listed above: ? ?Exercise SPECT ?TTE ?Follow up with Dr. Antoine Poche ? ?   ? ?Shared Decision Making/Informed Consent ?The risks [chest pain, shortness of breath, cardiac arrhythmias, dizziness, blood pressure fluctuations, myocardial infarction, stroke/transient ischemic attack, nausea, vomiting, allergic reaction, radiation exposure, metallic taste sensation and life-threatening complications (estimated to be 1 in 10,000)], benefits (risk stratification, diagnosing coronary artery disease, treatment guidance) and alternatives of a nuclear stress test were discussed in detail with [name] and he agrees to proceed.  ? ? ?Medication Adjustments/Labs and Tests Ordered: ?Current medicines are reviewed at length with the patient today.  Concerns regarding medicines are outlined above.  ?No orders of the defined types were placed in this encounter. ? ?No orders  of the defined types were placed in this encounter. ? ? ?There are no Patient Instructions on file for this visit.  ? ?Signed, ?Maisie Fus, MD  ?11/15/2021 1:55 PM    ?[facility] ?

## 2021-11-16 NOTE — Addendum Note (Signed)
Addended by: Orlene Och on: 11/16/2021 10:34 AM ? ? Modules accepted: Orders ? ?

## 2021-11-22 ENCOUNTER — Telehealth (HOSPITAL_COMMUNITY): Payer: Self-pay

## 2021-11-22 NOTE — Telephone Encounter (Signed)
Spoke with the patient, detailed instructions were given. He stated that he would be here for his test. Asked to call back with any questions. S.Batul Diego EMTP 

## 2021-11-27 ENCOUNTER — Ambulatory Visit (HOSPITAL_BASED_OUTPATIENT_CLINIC_OR_DEPARTMENT_OTHER): Payer: PPO

## 2021-11-27 ENCOUNTER — Other Ambulatory Visit: Payer: Self-pay

## 2021-11-27 ENCOUNTER — Ambulatory Visit (HOSPITAL_COMMUNITY): Payer: PPO | Attending: Cardiology

## 2021-11-27 DIAGNOSIS — R0602 Shortness of breath: Secondary | ICD-10-CM

## 2021-11-27 DIAGNOSIS — Q231 Congenital insufficiency of aortic valve: Secondary | ICD-10-CM | POA: Diagnosis not present

## 2021-11-27 LAB — MYOCARDIAL PERFUSION IMAGING
Angina Index: 2
Duke Treadmill Score: -8
Estimated workload: 6.4
Exercise duration (min): 4 min
Exercise duration (sec): 31 s
LV dias vol: 110 mL (ref 62–150)
LV sys vol: 51 mL
MPHR: 148 {beats}/min
Nuc Stress EF: 54 %
Peak HR: 142 {beats}/min
Percent HR: 95 %
RPE: 19
Rest HR: 42 {beats}/min
Rest Nuclear Isotope Dose: 10.9 mCi
SDS: 1
SRS: 0
SSS: 1
ST Depression (mm): 1 mm
Stress Nuclear Isotope Dose: 31.2 mCi
TID: 0.94

## 2021-11-27 LAB — ECHOCARDIOGRAM COMPLETE
AR max vel: 1.33 cm2
AV Area VTI: 1.18 cm2
AV Area mean vel: 1.18 cm2
AV Mean grad: 25 mmHg
AV Peak grad: 44.5 mmHg
Ao pk vel: 3.34 m/s
Area-P 1/2: 3.16 cm2
S' Lateral: 2.7 cm

## 2021-11-27 MED ORDER — TECHNETIUM TC 99M TETROFOSMIN IV KIT
10.9000 | PACK | Freq: Once | INTRAVENOUS | Status: AC | PRN
  Administered 2021-11-27: 10.9 via INTRAVENOUS
  Filled 2021-11-27: qty 11

## 2021-11-27 MED ORDER — TECHNETIUM TC 99M TETROFOSMIN IV KIT
31.2000 | PACK | Freq: Once | INTRAVENOUS | Status: AC | PRN
  Administered 2021-11-27: 31.2 via INTRAVENOUS
  Filled 2021-11-27: qty 32

## 2021-11-30 ENCOUNTER — Telehealth: Payer: Self-pay | Admitting: Internal Medicine

## 2021-11-30 NOTE — Telephone Encounter (Signed)
-  Pt state he saw ECHO and stress test results via mychart but would like to schedule an appointment to discuss results in person. ?-Pt state he understand Dr. Wyline Mood indicated results were normal but state it still doesn't explain his symptoms and why he had chest pain during stress test. ?-Appointment scheduled for 4/21 with Dr. Antoine Poche (primary cardiologist).  ? ?Maisie Fus, MD  ?11/28/2021  8:40 AM EDT   ?  ?Hi Jenna, please let Mr. Schmutz know that his echo showed normal heart function and no significant valve disease. His stress test was normal. Sending to his primary cardiologist.  ? ?

## 2021-11-30 NOTE — Telephone Encounter (Signed)
New Message: ? ? ? ? ?Patient said he received his results from his test on My Chart. He wants to know if he can come in person and see Dr Jesse Hardy to discuss the results please? ?

## 2021-12-27 NOTE — Progress Notes (Signed)
Questions about the mildly reduced ejection fraction on the perfusion study.He is not  ?  ?Cardiology Office Note ? ? ?Date:  12/28/2021  ? ?ID:  Jesse Curtobert B Stepney Jr., DOB 03/02/1951, MRN [id number] ? ?PCP:  Jesse Hardy, Jesse K, MD  ?Cardiologist:   Jesse RotundaJames Xylan Sheils, MD ?Referring:  Jesse Hardy, Jesse K, MD ? ? ?Chief Complaint  ?Patient presents with  ? Chest Pain  ? ? ? ?  ?History of Present Illness: ?Jesse CurtRobert B Donovan Jr. is a 71 y.o. male who presents for follow up of a murmur.  He had bradycardia. He did have chest pain and had an abnormal stress test.   He subsequently had a cardiac cath which demonstrated non obstructive disease.   Echo demonstrated mild AS.   He was seen by Dr. Carolan ClinesMary Branch since I last saw him and he had increased SOB.  He was sent for SPECT and there was no suggestion of ischemia.  EF was mildly low.    He had normal EF on echo and moderate AS.   ? ?He has been looking over the results.  He is still getting some chest discomfort.  He had some shooting pain that was just fleeting in his right arm.  He had a little bit on his left arm.  The symptoms are the same as prior to his stress test recently.  He is not sure whether it was the same as his catheterization in 2019 when he had nonobstructive disease.  He comes on at rest.  He is joined Jesse Hardy and they are going to start going there for exercise.  He does have a little substernal discomfort but this is inconsistent.  It comes on sporadically.  Its not any different than previous.  He is not having any new shortness of breath, PND or orthopnea.  He is not having any new palpitations, presyncope or syncope. ? ? ?Past Medical History:  ?Diagnosis Date  ? Asthma   ? GERD (gastroesophageal reflux disease)   ? Hyperlipidemia   ? Hypertension   ? ?Past Surgical History:  ?Procedure Laterality Date  ? APPENDECTOMY  1972  ? BACK SURGERY  2018  ? LEFT HEART CATH AND CORONARY ANGIOGRAPHY N/A 06/09/2018  ? Procedure: LEFT HEART CATH AND CORONARY ANGIOGRAPHY;  Surgeon:  SwazilandJordan, Peter M, MD;  [facility];  Service: Cardiovascular;  Laterality: N/A;  ? ROTATOR CUFF REPAIR  2017  ? TONSILECTOMY, ADENOIDECTOMY, BILATERAL MYRINGOTOMY AND TUBES  1958  ? VASECTOMY  1985  ? ? ?Current Outpatient Medications  ?Medication Sig Dispense Refill  ? ammonium lactate (LAC-HYDRIN) 12 % lotion Apply 1 application topically as needed for dry skin.     ? aspirin 81 MG tablet Take 81 mg by mouth daily.     ? Cholecalciferol 125 MCG (5000 UT) TABS Take 5,000 Units by mouth daily.    ? clotrimazole (LOTRIMIN) 1 % cream Apply 1 application topically 2 (two) times daily as needed (irritation).     ? finasteride (PROSCAR) 5 MG tablet Take 1 tablet (5 mg total) by mouth daily.    ? losartan (COZAAR) 50 MG tablet Take 50 mg by mouth at bedtime.    ? montelukast (SINGULAIR) 10 MG tablet Take 10 mg by mouth at bedtime.    ? Multiple Vitamin (MULTIVITAMIN) tablet Take 1 tablet by mouth daily.    ? Omega-3 Fatty Acids (FISH OIL) 1200 MG CAPS Take 2,400 mg by mouth daily.    ? omeprazole (PRILOSEC) 20  MG capsule Take 20 mg by mouth daily.     ? oxybutynin (DITROPAN XL) 15 MG 24 hr tablet Take 15 mg by mouth daily.    ? pravastatin (PRAVACHOL) 80 MG tablet Take 1 tablet (80 mg total) by mouth every evening. 90 tablet 3  ? tadalafil (CIALIS) 5 MG tablet Take 1 tablet (5 mg total) by mouth daily as needed for erectile dysfunction. 10 tablet 0  ? tamsulosin (FLOMAX) 0.4 MG CAPS capsule 1 capsule    ? triamcinolone cream (KENALOG) 0.1 % Apply 1 application topically 2 (two) times daily as needed (dryness, skin spots).   1  ? TURMERIC PO Take 1,000 mg by mouth daily.    ? ?No current facility-administered medications for this visit.  ? ? ?Allergies:   Penicillins  ? ?ROS:  Please see the history of present illness.   Otherwise, review of systems are positive for none.   All other systems are reviewed and negative.  ? ? ?PHYSICAL EXAM: ?VS:  BP (!) 156/86   Pulse 70   Ht 5\' 6"  (1.676 m)   Wt 213 lb  (96.6 kg)   SpO2 94%   BMI 34.38 kg/m?  , BMI Body mass index is 34.38 kg/m?. ?GENERAL:  Well appearing ?NECK:  No jugular venous distention, waveform within normal limits, carotid upstroke brisk and symmetric, no bruits, no thyromegaly ?LUNGS:  Clear to auscultation bilaterally ?CHEST:  Unremarkable ?HEART:  PMI not displaced or sustained,S1 and S2 within normal limits, no S3, no S4, no clicks, no rubs, 2 out of 6 apical systolic murmur radiating at the aortic outflow tract and mid peaking, no diastolic murmurs ?ABD:  Flat, positive bowel sounds normal in frequency in pitch, no bruits, no rebound, no guarding, no midline pulsatile mass, no hepatomegaly, no splenomegaly ?EXT:  2 plus pulses throughout, mild bilateral lower extremity edema, no cyanosis no clubbing ? ? ?EKG:  EKG is not ordered today. ? ? ?07/05/2021: BUN 16; Creatinine, Ser 1.00; Potassium 4.9; Sodium 141  ? ? ?Lipid Panel ?   ?Component Value Date/Time  ? CHOL 148 10/10/2021 1141  ? TRIG 74 10/10/2021 1141  ? HDL 55 10/10/2021 1141  ? CHOLHDL 2.7 10/10/2021 1141  ? LDLCALC 79 10/10/2021 1141  ? ?  ? ?Wt Readings from Last 3 Encounters:  ?12/28/21 213 lb (96.6 kg)  ?11/27/21 211 lb (95.7 kg)  ?11/15/21 211 lb (95.7 kg)  ?  ? ? ?Other studies Reviewed: ?Additional studies/ records that were reviewed today include: SPECT, echo ?Review of the above records demonstrates: See elsewhere ? ? ?ASSESSMENT AND PLAN: ? ?BRADYCARDIA:    He is not having symptoms related to this.  No change in therapy.  ? ?AS:   This was moderate.   I will follow this with an echo in March.  We talked about this at length and he has no symptoms related to this. ? ?CHEST PAIN:    There are no high risk findings.  His chest pain sounds mostly nonanginal.  I would believe the results of the perfusion study.  No further imaging but he is to tell me if his symptoms change in character or quality.  ? ?AORTIC ATHEROSCLEROSIS.   He is to participate aggressively in primary risk  reduction. ? ?ASCENDING AORTIC ANEURYSM:   This was 39 I will likely follow this up in November 2025 although he will have an image of this on his echo next year. ? ?DYSLIPIDEMIA: Patient's LDL was improved to 79.  He will remain on the meds as listed ? ?OBESITY: We have talked about specific diet and exercise. ? ?Current medicines are reviewed at length with the patient today.  The patient does not have concerns regarding medicines. ? ?The following changes have been made: None ? ?Labs/ tests ordered today include:  ? ?Orders Placed This Encounter  ?Procedures  ? ECHOCARDIOGRAM COMPLETE  ? ? ?Disposition:   FU with me after the echocardiogram. ? ? ?Signed, ?Jesse Rotunda, MD  ?12/28/2021 11:30 AM    ?[facility] ?

## 2021-12-28 ENCOUNTER — Encounter: Payer: Self-pay | Admitting: Cardiology

## 2021-12-28 ENCOUNTER — Ambulatory Visit: Payer: PPO | Admitting: Cardiology

## 2021-12-28 VITALS — BP 156/86 | HR 70 | Ht 66.0 in | Wt 213.0 lb

## 2021-12-28 DIAGNOSIS — R072 Precordial pain: Secondary | ICD-10-CM | POA: Diagnosis not present

## 2021-12-28 DIAGNOSIS — I7121 Aneurysm of the ascending aorta, without rupture: Secondary | ICD-10-CM | POA: Diagnosis not present

## 2021-12-28 DIAGNOSIS — E785 Hyperlipidemia, unspecified: Secondary | ICD-10-CM

## 2021-12-28 DIAGNOSIS — I350 Nonrheumatic aortic (valve) stenosis: Secondary | ICD-10-CM

## 2021-12-28 NOTE — Patient Instructions (Signed)
Medication Instructions:  ?No changes ?*If you need a refill on your cardiac medications before your next appointment, please call your pharmacy* ? ? ?Lab Work: ?None ordered ?If you have labs (blood work) drawn today and your tests are completely normal, you will receive your results only by: ?MyChart Message (if you have MyChart) OR ?A paper copy in the mail ?If you have any lab test that is abnormal or we need to change your treatment, we will call you to review the results. ? ? ?Testing/Procedures: ?Your physician has requested that you have an echocardiogram in March 2024. Echocardiography is a painless test that uses sound waves to create images of your heart. It provides your doctor with information about the size and shape of your heart and how well your heart?s chambers and valves are working. You may receive an ultrasound enhancing agent through an IV if needed to better visualize your heart during the echo.This procedure takes approximately one hour. There are no restrictions for this procedure. This will take place at the [address], [address].  ? ? ? ?Follow-Up: ?At [facility], you and your health needs are our priority.  As part of our continuing mission to provide you with exceptional heart care, we have created designated Provider Care Teams.  These Care Teams include your primary Cardiologist (physician) and Advanced Practice Providers (APPs -  Physician Assistants and Nurse Practitioners) who all work together to provide you with the care you need, when you need it. ? ?We recommend signing up for the patient portal called "MyChart".  Sign up information is provided on this After Visit Summary.  MyChart is used to connect with patients for Virtual Visits (Telemedicine).  Patients are able to view lab/test results, encounter notes, upcoming appointments, etc.  Non-urgent messages can be sent to your provider as well.   ?To learn more about what you can do with MyChart, go to  [email or web address].   ? ?Your next appointment:   ?6 month(s) ? ?The format for your next appointment:   ?In Person ? ?Provider:   ?Rollene Rotunda, MD { ? ?Important Information About Sugar ? ? ? ? ? ? ?

## 2022-02-18 ENCOUNTER — Ambulatory Visit: Payer: PPO | Admitting: Cardiology

## 2022-03-04 IMAGING — CT CT ANGIO CHEST
2 of 6 series · 13 of 36 positions shown · IV contrast (iopamidol)
Comparison: 06/30/2019

CLINICAL DATA: 69-year-old male with a history ascending aorta
dilation

EXAM:
CT ANGIOGRAPHY CHEST WITH CONTRAST
TECHNIQUE: Multidetector CT imaging of the chest was performed using the
standard protocol during bolus administration of intravenous
contrast. Multiplanar CT image reconstructions and MIPs were
obtained to evaluate the vascular anatomy.
CONTRAST:  75mL VS0NPR-4G8 IOPAMIDOL (VS0NPR-4G8) INJECTION 76%

[Series 5: cta thorax 2.00 bv36 s3 axial arterial · axial · arterial · 0.63mm/px · z∈[+1576,+1858]mm · 12 of 167 slices shown]
[im 13/167  lung]
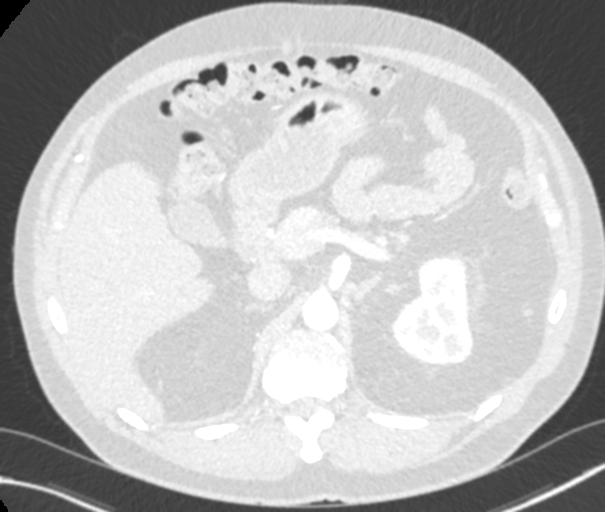
[im 26/167  mediastinal]
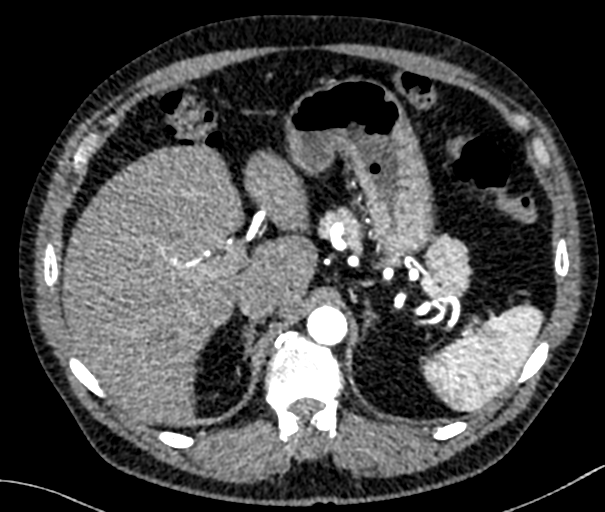
[im 39/167  lung]
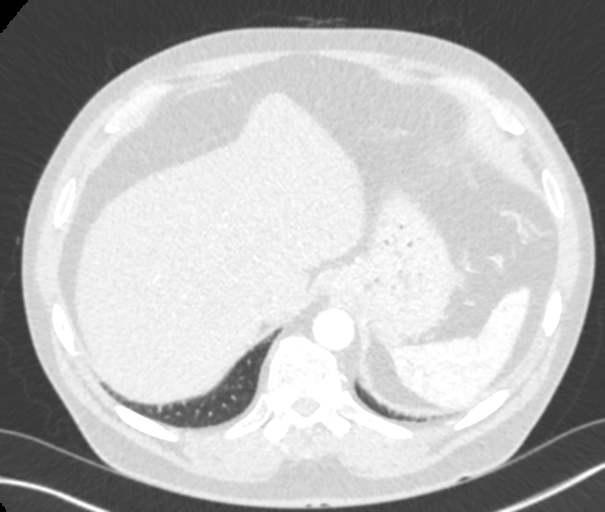
[im 52/167  mediastinal]
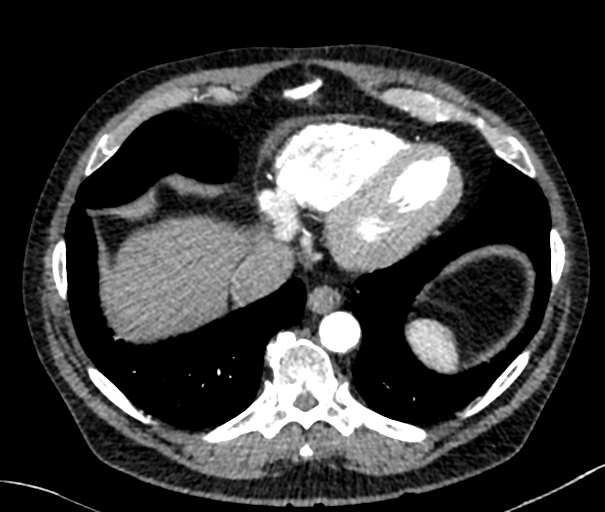
[im 64/167  lung]
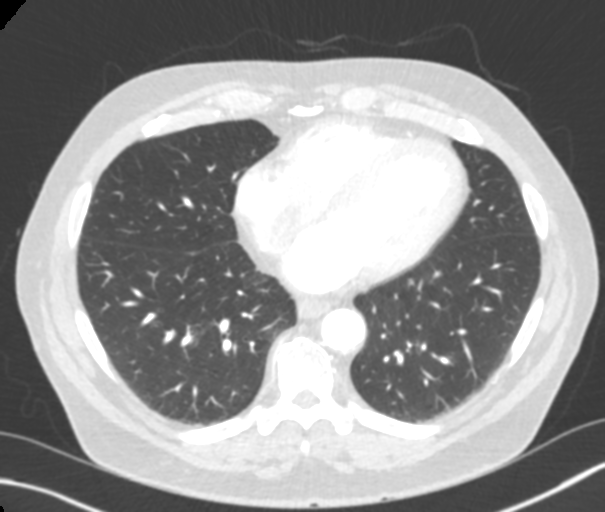
[im 77/167  mediastinal]
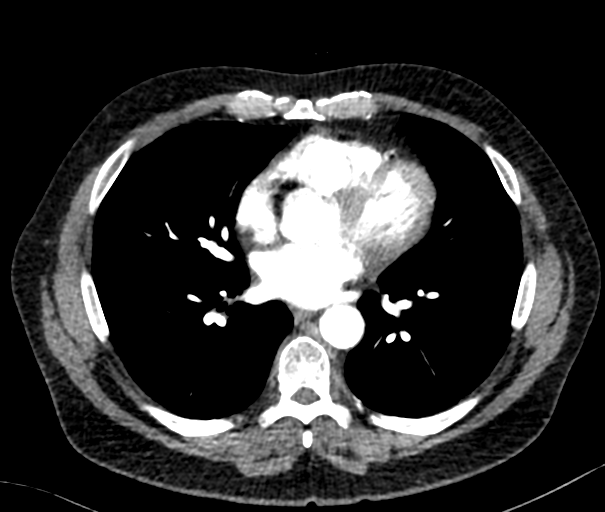
[im 90/167  lung]
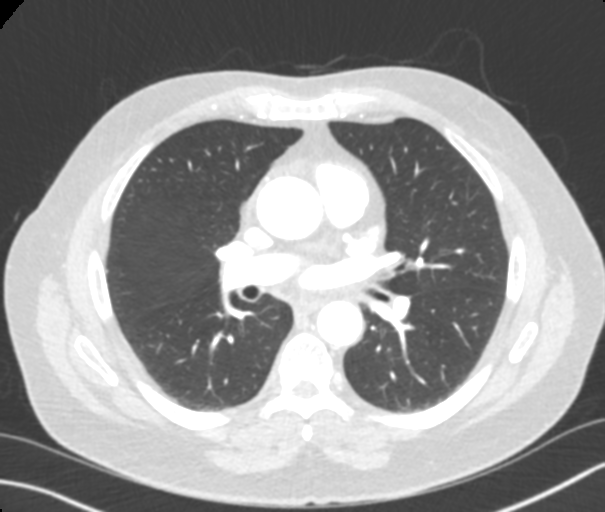
[im 103/167  mediastinal]
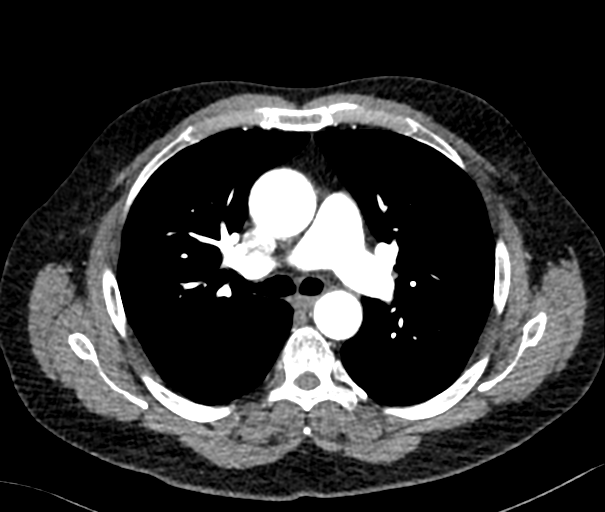
[im 115/167  lung]
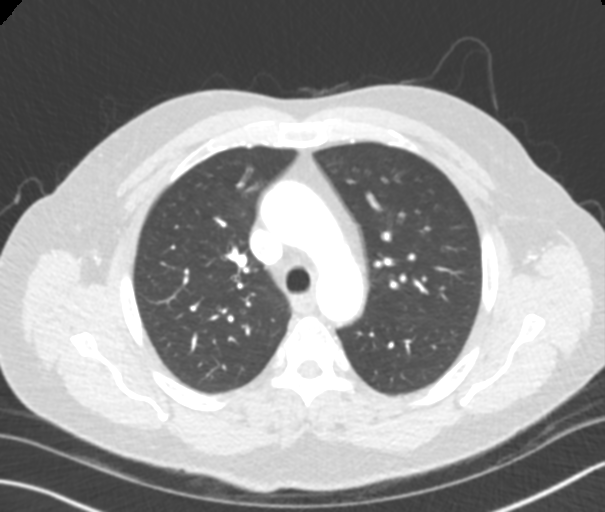
[im 128/167  mediastinal]
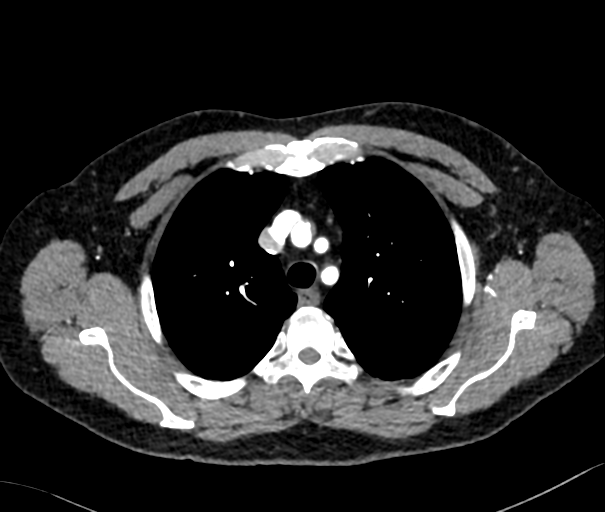
[im 141/167  lung]
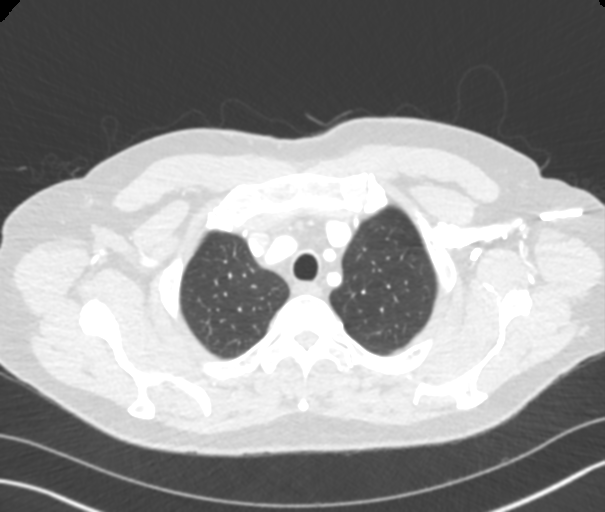
[im 154/167  mediastinal]
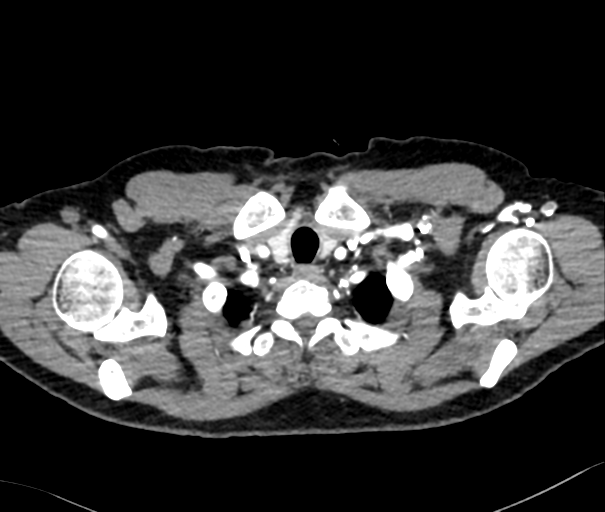

[Series 10: cta thorax 2.00 bv36 s3 cor st · coronal · 0.68mm/px · 1 of 161 slices shown]
[im 81/161  mediastinal]
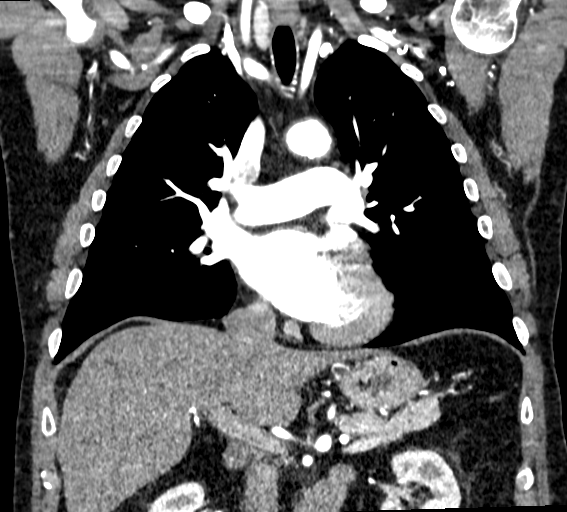

[13 of 36 positions shown; findings below may reference images not displayed]

FINDINGS: Cardiovascular:

Heart:

No cardiomegaly. No pericardial fluid/thickening. No significant
coronary calcifications.

Aorta:

Mild aortic valve calcifications. Diameter of the ascending aorta is
estimated 4.2 cm, essentially unchanged from the comparison. Mild
atherosclerotic changes. Three vessel arch. Cervical arteries
present at the base of the neck.

No dissection.  No periaortic fluid or inflammatory changes.

Pulmonary arteries:

Timing of the contrast bolus is not optimized for evaluation of
pulmonary artery filling defects.

Mediastinum/Nodes: No mediastinal adenopathy. Unremarkable
appearance of the thoracic esophagus.

Unremarkable appearance of the thoracic inlet.

Lungs/Pleura: Central airways are clear. No pleural effusion. No
confluent airspace disease.

No pneumothorax.

Upper Abdomen: No acute.

Musculoskeletal: No acute displaced fracture. Degenerative changes
of the spine.

Review of the MIP images confirms the above findings.
IMPRESSION: Unchanged diameter of the ascending aorta, estimated current CT
cm. Recommend annual imaging followup by CTA or MRA. This
recommendation follows 4898
ACCF/AHA/AATS/ACR/ASA/SCA/KLPIGBB/JERRY/JUMPER/VIRK Guidelines for the
Diagnosis and Management of Patients with Thoracic Aortic Disease.
Circulation. 4898; 121: E266-e369. Aortic aneurysm NOS (VNAKM-165.N)

Aortic Atherosclerosis (VNAKM-6RK.K).

## 2022-06-03 ENCOUNTER — Other Ambulatory Visit: Payer: Self-pay | Admitting: Cardiology

## 2022-06-11 ENCOUNTER — Ambulatory Visit: Payer: PPO | Admitting: Cardiology

## 2022-07-21 NOTE — Progress Notes (Signed)
Cardiology Office Note   Date:  07/22/2022   ID:  Jesse Curt., DOB 1950-12-08, MRN [id number]  PCP:  Macy Mis, MD  Cardiologist:   Rollene Rotunda, MD Referring:  Macy Mis, MD   Chief Complaint  Patient presents with   Dizziness     History of Present Illness: Jesse Raether. is a 71 y.o. male who presents for follow up of a murmur.  He had bradycardia. He did have chest pain and had an abnormal stress test.   He subsequently had a cardiac cath which demonstrated non obstructive disease.   Echo demonstrated mild AS.   He was seen by Dr. Carolan Clines and he had increased SOB.  He was sent for SPECT and there was no suggestion of ischemia.  EF was mildly low.   He has had moderate AS on Echo.  The last echo was March 2023 and this again demonstrated normal EF and moderate AS.    He presents for follow-up.  He is had 1 episode of lightheadedness.  He shows me some blood pressure readings on 11 9 and his blood pressure is running 80s that day.  He was outside working and he felt lightheaded.  However, on days prior to that and since then his blood pressures been in the 130s and even systolics in the 140s.  He was not bradycardic on that day.  He was not having any chest discomfort.  He has not been having any shortness of breath, PND or orthopnea.  He has not been having any palpitations.  He has had weight loss because he has to change his diet for GERD.   Past Medical History:  Diagnosis Date   Asthma    GERD (gastroesophageal reflux disease)    Hyperlipidemia    Hypertension    Past Surgical History:  Procedure Laterality Date   APPENDECTOMY  1972   BACK SURGERY  2018   LEFT HEART CATH AND CORONARY ANGIOGRAPHY N/A 06/09/2018   Procedure: LEFT HEART CATH AND CORONARY ANGIOGRAPHY;  Surgeon: Swaziland, Peter M, MD;  Location: [facility] INVASIVE CV LAB;  Service: Cardiovascular;  Laterality: N/A;   ROTATOR CUFF REPAIR  2017   TONSILECTOMY, ADENOIDECTOMY, BILATERAL  MYRINGOTOMY AND TUBES  1958   VASECTOMY  1985    Current Outpatient Medications  Medication Sig Dispense Refill   ammonium lactate (LAC-HYDRIN) 12 % lotion Apply 1 application topically as needed for dry skin.      aspirin EC 81 MG tablet Take by mouth.     Cholecalciferol 125 MCG (5000 UT) TABS Take 5,000 Units by mouth daily.     clotrimazole (LOTRIMIN) 1 % cream Apply 1 application topically 2 (two) times daily as needed (irritation).      Cyanocobalamin 5000 MCG TBDP Take by mouth.     DULoxetine (CYMBALTA) 30 MG capsule Take by mouth.     finasteride (PROSCAR) 5 MG tablet Take 1 tablet (5 mg total) by mouth daily.     gabapentin (NEURONTIN) 100 MG capsule Take by mouth.     losartan (COZAAR) 50 MG tablet Take 50 mg by mouth at bedtime.     mometasone (ELOCON) 0.1 % lotion Apply 2 drops into the affected ear twice daily for up to 3 days, repeat as necessary.     montelukast (SINGULAIR) 10 MG tablet Take 10 mg by mouth at bedtime.     Multiple Vitamin (MULTIVITAMIN) tablet Take 1 tablet by mouth daily.  Omega-3 Fatty Acids (FISH OIL) 1200 MG CAPS Take 2,400 mg by mouth daily.     omeprazole (PRILOSEC) 20 MG capsule Take 20 mg by mouth daily.      oxybutynin (DITROPAN XL) 15 MG 24 hr tablet Take 15 mg by mouth daily.     pravastatin (PRAVACHOL) 80 MG tablet TAKE 1 TABLET BY MOUTH EVERY EVENING 90 tablet 0   tadalafil (CIALIS) 5 MG tablet Take 1 tablet (5 mg total) by mouth daily as needed for erectile dysfunction. 10 tablet 0   tamsulosin (FLOMAX) 0.4 MG CAPS capsule 1 capsule     triamcinolone cream (KENALOG) 0.1 % Apply 1 application topically 2 (two) times daily as needed (dryness, skin spots).   1   TURMERIC PO Take 1,000 mg by mouth daily.     No current facility-administered medications for this visit.    Allergies:   Penicillins   ROS:  Please see the history of present illness.   Otherwise, review of systems are positive for none.   All other systems are reviewed and  negative.    PHYSICAL EXAM: VS:  BP 126/82   Pulse 78   Ht 5\' 6"  (1.676 m)   Wt 180 lb 6.4 oz (81.8 kg)   SpO2 93%   BMI 29.12 kg/m  , BMI Body mass index is 29.12 kg/m. GENERAL:  Well appearing NECK:  No jugular venous distention, waveform within normal limits, carotid upstroke brisk and symmetric, no bruits, no thyromegaly LUNGS:  Clear to auscultation bilaterally CHEST:  Unremarkable HEART:  PMI not displaced or sustained,S1 and S2 within normal limits, no S3, no S4, no clicks, no rubs, 2 out of 6 apical systolic murmur mid peaking, no diastolic murmurs ABD:  Flat, positive bowel sounds normal in frequency in pitch, no bruits, no rebound, no guarding, no midline pulsatile mass, no hepatomegaly, no splenomegaly EXT:  2 plus pulses throughout, no edema, no cyanosis no clubbing   EKG:  EKG is not ordered today. NA  No results found for requested labs within last 365 days.    Lipid Panel    Component Value Date/Time   CHOL 148 10/10/2021 1141   TRIG 74 10/10/2021 1141   HDL 55 10/10/2021 1141   CHOLHDL 2.7 10/10/2021 1141   LDLCALC 79 10/10/2021 1141      Wt Readings from Last 3 Encounters:  07/22/22 180 lb 6.4 oz (81.8 kg)  12/28/21 213 lb (96.6 kg)  11/27/21 211 lb (95.7 kg)      Other studies Reviewed: Additional studies/ records that were reviewed today include: Labs Review of the above records demonstrates: See elsewhere   ASSESSMENT AND PLAN:  BRADYCARDIA:    The patient's had no symptomatic bradycardia arrhythmias.  He had previously heart rates in the 40s but has not documented any of this.  No change in therapy.   AS:   This was moderate on echo in March 2023.  His gradient increased from 12 mm mean in 2021 to 25 mm mean in 2023.   CHEST PAIN:     The patient had nonobstructive disease in 2019 on cath and a negative perfusion study.  I do not think he is describing anything that is active ischemia.  He has no new symptoms.   AORTIC ATHEROSCLEROSIS.    He is participating in risk reduction.  ASCENDING AORTIC ANEURYSM:   This was 42 I will likely follow this up in November 2025.  I reviewed this result with him today.  DYSLIPIDEMIA:  Patient's LDL was 79 with an HDL of 55.  OBESITY:    He has lost 33 pounds.   Current medicines are reviewed at length with the patient today.  The patient does not have concerns regarding medicines.  The following changes have been made: None  Labs/ tests ordered today include: None  No orders of the defined types were placed in this encounter.   Disposition:   FU with me in six months.    Signed, Rollene Rotunda, MD  07/22/2022 1:32 PM    [facility]

## 2022-07-22 ENCOUNTER — Encounter: Payer: Self-pay | Admitting: Cardiology

## 2022-07-22 ENCOUNTER — Ambulatory Visit: Payer: PPO | Attending: Cardiology | Admitting: Cardiology

## 2022-07-22 VITALS — BP 126/82 | HR 78 | Ht 66.0 in | Wt 180.4 lb

## 2022-07-22 DIAGNOSIS — I700 Atherosclerosis of aorta: Secondary | ICD-10-CM

## 2022-07-22 DIAGNOSIS — E785 Hyperlipidemia, unspecified: Secondary | ICD-10-CM

## 2022-07-22 DIAGNOSIS — I350 Nonrheumatic aortic (valve) stenosis: Secondary | ICD-10-CM

## 2022-07-22 DIAGNOSIS — R072 Precordial pain: Secondary | ICD-10-CM | POA: Diagnosis not present

## 2022-07-22 NOTE — Patient Instructions (Signed)
Medication Instructions:  Your physician recommends that you continue on your current medications as directed. Please refer to the Current Medication list given to you today.  *If you need a refill on your cardiac medications before your next appointment, please call your pharmacy*   Lab Work: NONE ordered at this time of appointment   If you have labs (blood work) drawn today and your tests are completely normal, you will receive your results only by: MyChart Message (if you have MyChart) OR A paper copy in the mail If you have any lab test that is abnormal or we need to change your treatment, we will call you to review the results.   Testing/Procedures: NONE ordered at this time of appointment   Follow-Up: At [facility], you and your health needs are our priority.  As part of our continuing mission to provide you with exceptional heart care, we have created designated Provider Care Teams.  These Care Teams include your primary Cardiologist (physician) and Advanced Practice Providers (APPs -  Physician Assistants and Nurse Practitioners) who all work together to provide you with the care you need, when you need it.   Your next appointment:   6 month(s)  The format for your next appointment:   In Person  Provider:   James Hochrein, MD     Other Instructions  Important Information About Sugar       

## 2022-10-01 ENCOUNTER — Other Ambulatory Visit: Payer: Self-pay | Admitting: Cardiology

## 2022-10-08 ENCOUNTER — Other Ambulatory Visit (HOSPITAL_COMMUNITY): Payer: Self-pay | Admitting: Gastroenterology

## 2022-10-08 DIAGNOSIS — R140 Abdominal distension (gaseous): Secondary | ICD-10-CM

## 2022-10-17 ENCOUNTER — Encounter (HOSPITAL_COMMUNITY)
Admission: RE | Admit: 2022-10-17 | Discharge: 2022-10-17 | Disposition: A | Discharge status: Home / Self Care | Payer: Medicare HMO | Source: Ambulatory Visit | Attending: Gastroenterology | Admitting: Gastroenterology

## 2022-10-17 DIAGNOSIS — R140 Abdominal distension (gaseous): Secondary | ICD-10-CM | POA: Insufficient documentation

## 2022-10-17 MED ORDER — TECHNETIUM TC 99M SULFUR COLLOID
2.0000 | Freq: Once | INTRAVENOUS | Status: AC | PRN
  Administered 2022-10-17: 2 via ORAL

## 2022-10-19 ENCOUNTER — Other Ambulatory Visit: Payer: Self-pay | Admitting: Cardiology

## 2022-11-21 ENCOUNTER — Ambulatory Visit (HOSPITAL_COMMUNITY): Payer: Medicare HMO | Attending: Internal Medicine

## 2022-11-21 DIAGNOSIS — I350 Nonrheumatic aortic (valve) stenosis: Secondary | ICD-10-CM | POA: Insufficient documentation

## 2022-11-21 LAB — ECHOCARDIOGRAM COMPLETE
AR max vel: 1.01 cm2
AV Area VTI: 0.94 cm2
AV Area mean vel: 1.01 cm2
AV Mean grad: 29 mmHg
AV Peak grad: 49 mmHg
Ao pk vel: 3.5 m/s
Area-P 1/2: 3.37 cm2
MV M vel: 5.68 m/s
MV Peak grad: 129 mmHg
S' Lateral: 2.7 cm

## 2023-01-19 DIAGNOSIS — R072 Precordial pain: Secondary | ICD-10-CM | POA: Insufficient documentation

## 2023-01-19 NOTE — Progress Notes (Unsigned)
  Cardiology Office Note:   Date:  01/20/2023  ID:  Jesse Curt., DOB 1951/05/19, MRN [id number]  History of Present Illness:   Jesse Debuhr. is a 72 y.o. male who presents for follow up of a murmur.  He had bradycardia. He did have chest pain and had an abnormal stress test.   He subsequently had a cardiac cath which demonstrated non obstructive disease.   Echo demonstrated mild AS.   He was seen by Dr. Carolan Clines and he had increased SOB.  He was sent for SPECT and there was no suggestion of ischemia.  EF was mildly low.   He has had moderate AS on Echo.  The last echo was March 2024 and this again demonstrated normal EF and moderate AS.     He presents for follow-up.  He just got back from [place] visiting family.  He is very physical around his house. The patient denies any new symptoms such as chest discomfort, neck or arm discomfort. There has been no new shortness of breath, PND or orthopnea. There have been no reported palpitations, presyncope or syncope.     ROS: As stated in the HPI and negative for all other systems.  Studies Reviewed:    EKG: Sinus rhythm, rate , left axis deviation, premature ectopic complexes, no acute ST-T wave changes.  Risk Assessment/Calculations:              Physical Exam:   VS:  BP 130/82   Pulse (!) 46   Ht 5\' 6"  (1.676 m)   Wt 179 lb (81.2 kg)   SpO2 98%   BMI 28.89 kg/m    Wt Readings from Last 3 Encounters:  01/20/23 179 lb (81.2 kg)  07/22/22 180 lb 6.4 oz (81.8 kg)  12/28/21 213 lb (96.6 kg)     GEN: Well nourished, well developed in no acute distress NECK: No JVD; No carotid bruits CARDIAC: RRR, 2 out of 6 apical systolic murmur radiating slightly at aortic outflow tract, no diastolic murmurs, rubs, gallops RESPIRATORY:  Clear to auscultation without rales, wheezing or rhonchi  ABDOMEN: Soft, non-tender, non-distended EXTREMITIES:  No edema; No deformity   ASSESSMENT AND PLAN:   BRADYCARDIA:   He has no symptoms related to  this.  No change in therapy.  AS:   This was moderate on echo in March 2024 and unchanged from 2023.  I will follow this again in March 2026   CHEST PAIN:   He has no new symptoms since he has nonobstructive disease was identified on cath in 2019.  He will continue with rhythm.  AORTIC ATHEROSCLEROSIS.  Again we are participating in primary risk reduction.   ASCENDING AORTIC ANEURYSM:   This was 42 mm I will follow this up in November 2025.   DYSLIPIDEMIA: Patient's LDL was 79.  I would like the LDL to be at least in the 40J is not lower and I will check this today along with an LP(a).   OBESITY:   He has had weight loss and kept this off and I congratulated him on this.  EDEMA: He has some mild leg edema and my plan is just compression stockings.        Signed, Rollene Rotunda, MD

## 2023-01-20 ENCOUNTER — Ambulatory Visit: Payer: Medicare HMO | Attending: Cardiology | Admitting: Cardiology

## 2023-01-20 ENCOUNTER — Encounter: Payer: Self-pay | Admitting: Cardiology

## 2023-01-20 VITALS — BP 130/82 | HR 46 | Ht 66.0 in | Wt 179.0 lb

## 2023-01-20 DIAGNOSIS — I7121 Aneurysm of the ascending aorta, without rupture: Secondary | ICD-10-CM

## 2023-01-20 DIAGNOSIS — I700 Atherosclerosis of aorta: Secondary | ICD-10-CM

## 2023-01-20 DIAGNOSIS — I350 Nonrheumatic aortic (valve) stenosis: Secondary | ICD-10-CM

## 2023-01-20 DIAGNOSIS — R072 Precordial pain: Secondary | ICD-10-CM | POA: Diagnosis not present

## 2023-01-20 DIAGNOSIS — R001 Bradycardia, unspecified: Secondary | ICD-10-CM

## 2023-01-20 NOTE — Patient Instructions (Signed)
Medication Instructions:  Your physician recommends that you continue on your current medications as directed. Please refer to the Current Medication list given to you today.  *If you need a refill on your cardiac medications before your next appointment, please call your pharmacy*   Lab Work: Your physician recommends that you have labs drawn today: Lipids & LPa  If you have labs (blood work) drawn today and your tests are completely normal, you will receive your results only by: MyChart Message (if you have MyChart) OR A paper copy in the mail If you have any lab test that is abnormal or we need to change your treatment, we will call you to review the results.    Testing/Procedures: Your physician has requested that you have an echocardiogram. Echocardiography is a painless test that uses sound waves to create images of your heart. It provides your doctor with information about the size and shape of your heart and how well your heart's chambers and valves are working. This procedure takes approximately one hour. There are no restrictions for this procedure. Please do NOT wear cologne, perfume, aftershave, or lotions (deodorant is allowed). Please arrive 15 minutes prior to your appointment time. To do in February 2026.   Non-Cardiac CT Angiography (CTA) chest/aorta, is a special type of CT scan that uses a computer to produce multi-dimensional views of major blood vessels throughout the body. In CT angiography, a contrast material is injected through an IV to help visualize the blood vessels To do in November 2025.    Follow-Up: At [facility], you and your health needs are our priority.  As part of our continuing mission to provide you with exceptional heart care, we have created designated Provider Care Teams.  These Care Teams include your primary Cardiologist (physician) and Advanced Practice Providers (APPs -  Physician Assistants and Nurse Practitioners) who all work  together to provide you with the care you need, when you need it.  We recommend signing up for the patient portal called "MyChart".  Sign up information is provided on this After Visit Summary.  MyChart is used to connect with patients for Virtual Visits (Telemedicine).  Patients are able to view lab/test results, encounter notes, upcoming appointments, etc.  Non-urgent messages can be sent to your provider as well.   To learn more about what you can do with MyChart, go to [email or web address].    Your next appointment:   12 month(s)  Provider:   Rollene Rotunda, MD

## 2023-01-21 LAB — LIPID PANEL
Chol/HDL Ratio: 3.1 ratio (ref 0.0–5.0)
Cholesterol, Total: 188 mg/dL (ref 100–199)
HDL: 60 mg/dL (ref >39)
LDL Chol Calc (NIH): 117 mg/dL — ABNORMAL HIGH (ref 0–99)
Triglycerides: 57 mg/dL (ref 0–149)
VLDL Cholesterol Cal: 11 mg/dL (ref 5–40)

## 2023-01-21 LAB — LIPOPROTEIN A (LPA): Lipoprotein (a): 119 nmol/L — ABNORMAL HIGH (ref <75.0)

## 2023-01-23 ENCOUNTER — Telehealth: Payer: Self-pay | Admitting: *Deleted

## 2023-01-23 MED ORDER — ATORVASTATIN CALCIUM 40 MG PO TABS: 40.0000 mg | ORAL_TABLET | Freq: Every day | ORAL | 3 refills | Status: DC

## 2023-01-23 NOTE — Telephone Encounter (Signed)
pt aware of results  New script sent to the pharmacy  

## 2023-01-23 NOTE — Telephone Encounter (Signed)
-----   Message from Rollene Rotunda, MD sent at 01/22/2023  8:48 PM EDT ----- LDL is mildly higher than I would like and I would like to stop his Pravachol and start Lipitor 40 mg PO daily.  Call Mr. Shockley with the results and send results to Macy Mis, MD.  Repeat a lipid in 3 years.

## 2023-02-26 IMAGING — CT CT ANGIO CHEST
2 of 6 series · 13 of 36 positions shown · IV contrast (iopamidol)
Comparison: 07/18/2020

CLINICAL DATA: Ascending aortic dilatation

EXAM:
CT ANGIOGRAPHY CHEST WITH CONTRAST
TECHNIQUE: Multidetector CT imaging of the chest was performed using the
standard protocol during bolus administration of intravenous
contrast. Multiplanar CT image reconstructions and MIPs were
obtained to evaluate the vascular anatomy.
CONTRAST:  75mL E3PBV2-77P IOPAMIDOL (E3PBV2-77P) INJECTION 76%

[Series 4: cta thorax 2.00 bv36 s3 axial arterial · axial · arterial · 0.66mm/px · z∈[+1550,+1834]mm · 12 of 168 slices shown]
[im 13/168  lung]
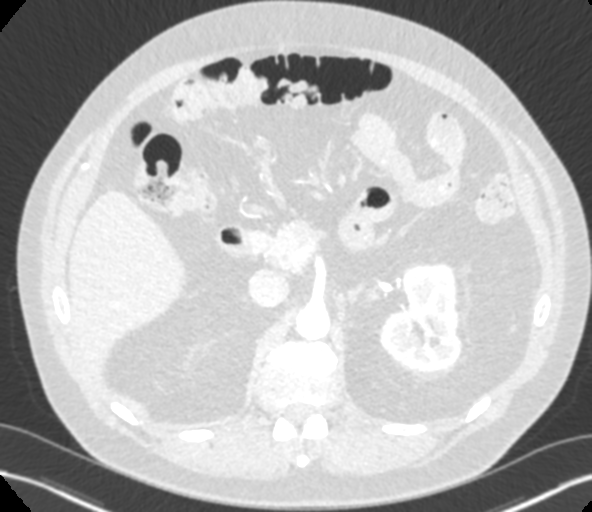
[im 26/168  mediastinal]
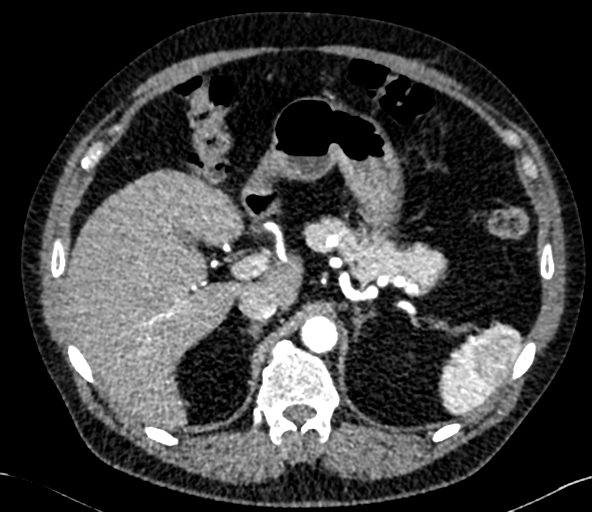
[im 39/168  lung]
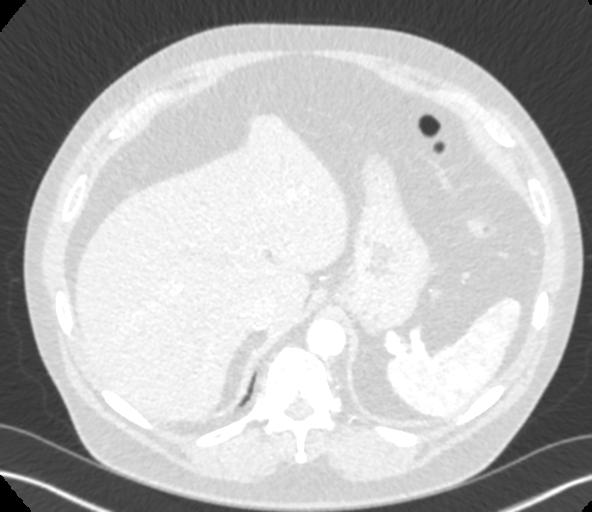
[im 52/168  mediastinal]
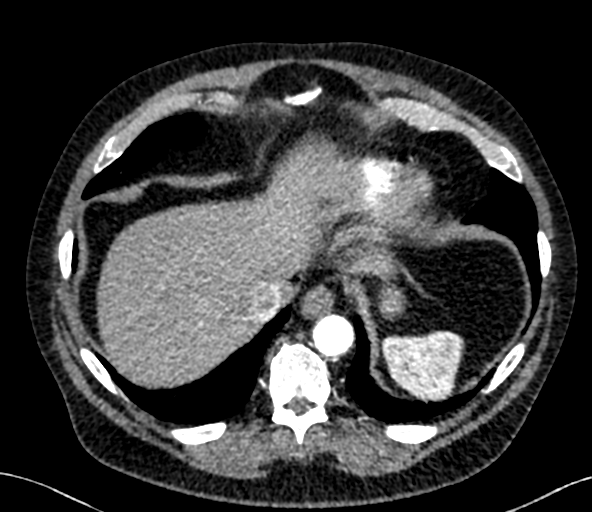
[im 65/168  lung]
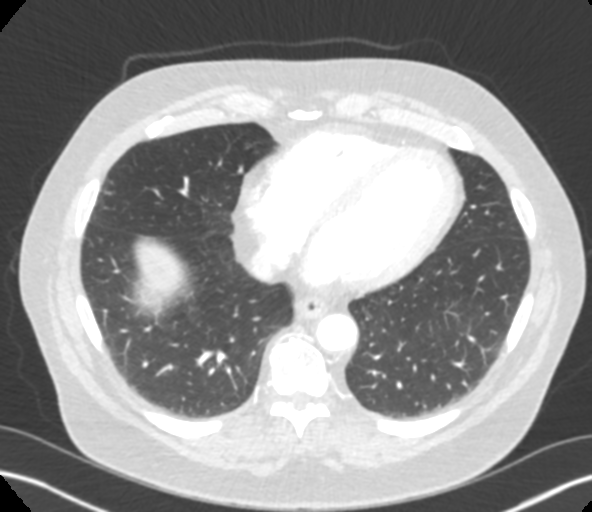
[im 78/168  mediastinal]
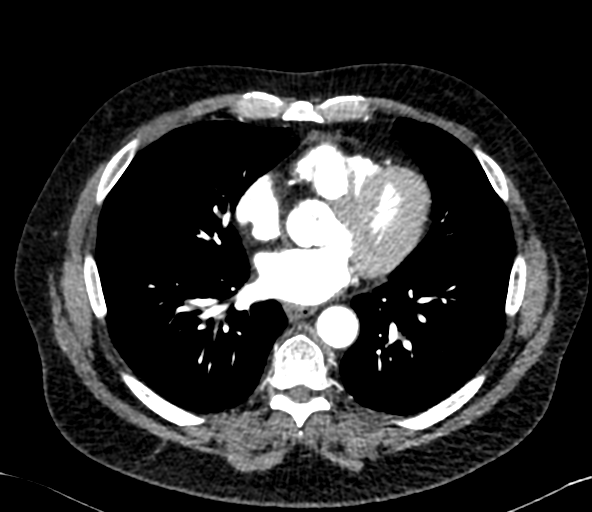
[im 90/168  lung]
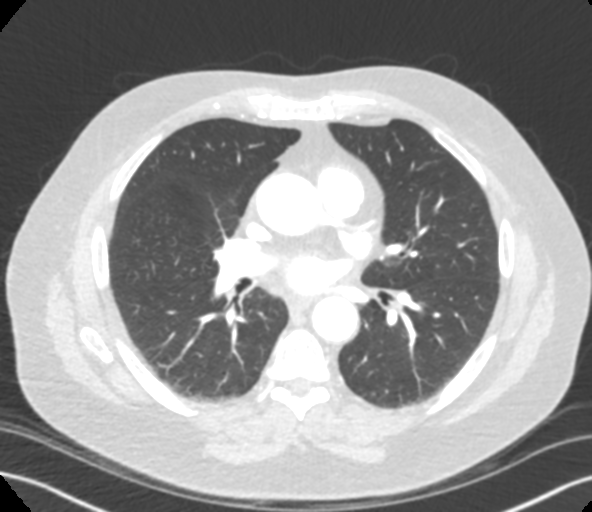
[im 103/168  mediastinal]
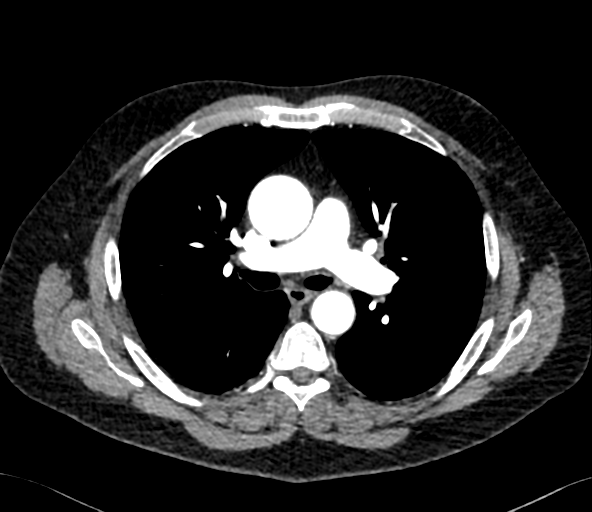
[im 116/168  lung]
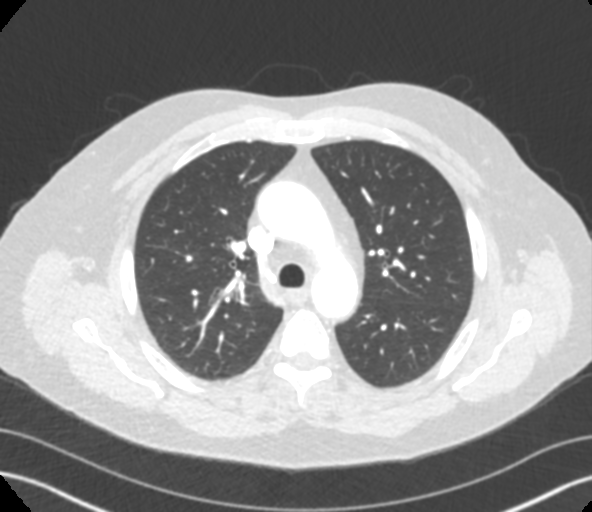
[im 129/168  mediastinal]
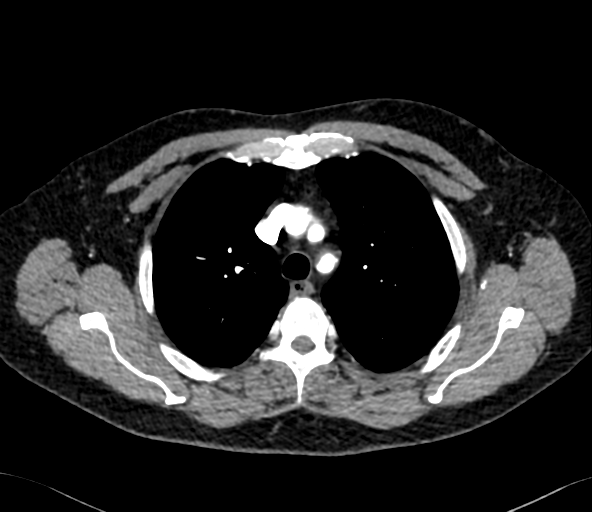
[im 142/168  lung]
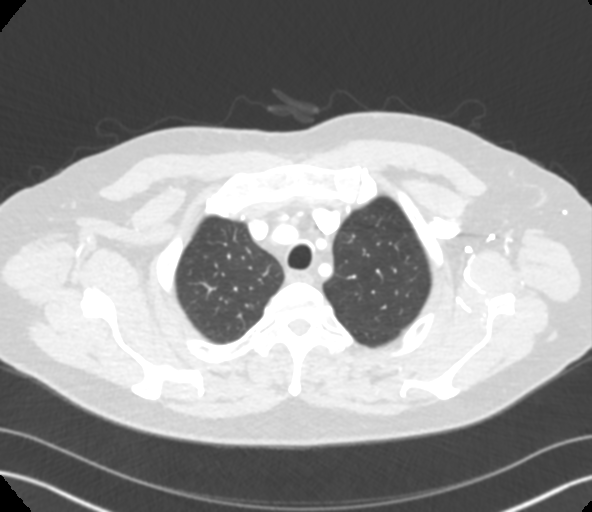
[im 155/168  mediastinal]
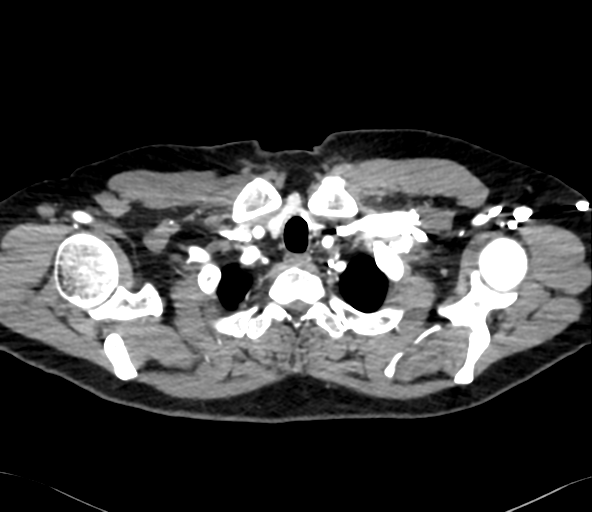

[Series 9: cta thorax 2.00 bv36 s3 cor st · coronal · 0.66mm/px · 1 of 169 slices shown]
[im 85/169  mediastinal]
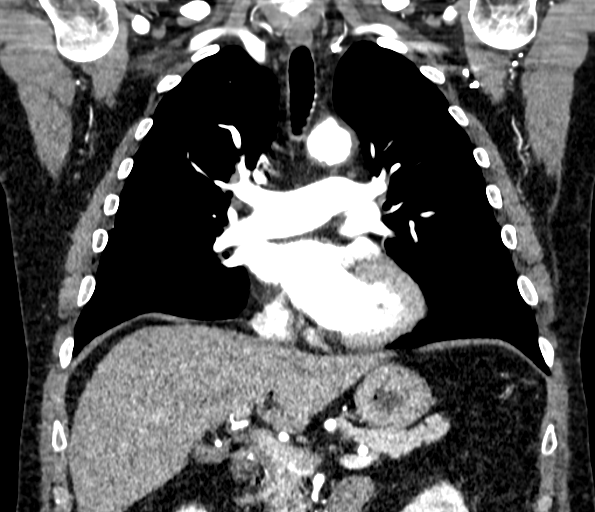

[13 of 36 positions shown; findings below may reference images not displayed]

FINDINGS: Cardiovascular:  Heart size within normal limits.

Aortic valve annular calcifications are present.

There is unchanged aneurysm of the ascending thoracic aorta
measuring up to 4.2 cm.

Mediastinum/Nodes: No enlarged mediastinal, hilar, or axillary lymph
nodes. Thyroid gland, trachea, and esophagus demonstrate no
significant findings.

Lungs/Pleura: Lungs are clear. No pleural effusion or pneumothorax.

Upper Abdomen: No acute abnormality.

Musculoskeletal: No chest wall abnormality. No acute or significant
osseous findings.

Review of the MIP images confirms the above findings.
IMPRESSION: Unchanged ascending aortic aneurysm measuring maximum of 4.2 cm.

## 2023-06-12 ENCOUNTER — Telehealth: Payer: Self-pay | Admitting: Cardiology

## 2023-06-12 NOTE — Telephone Encounter (Signed)
Patient called to follow-up on getting orders for a scan as was discussed at his last visit with Dr. Antoine Poche.

## 2023-06-12 NOTE — Telephone Encounter (Signed)
Patient did not know which scan but per his last OV he is to have a CT Scan for November 2025 and a Echo  for November 2026.  He will call a few months prior to those dates to ensure there is an active order

## 2023-11-10 ENCOUNTER — Other Ambulatory Visit: Payer: Self-pay | Admitting: Cardiology

## 2024-03-08 DIAGNOSIS — M7989 Other specified soft tissue disorders: Secondary | ICD-10-CM | POA: Insufficient documentation

## 2024-03-08 NOTE — Progress Notes (Unsigned)
 Cardiology Office Note:   Date:  03/09/2024  ID:  Jesse KATHEE Crispin Mickey., DOB Jan 09, 1951, MRN [id number]  History of Present Illness:   Jesse Konecny. is a 73 y.o. male who presents for follow up of a murmur.  He had bradycardia. He did have chest pain and had an abnormal stress test.   He subsequently had a cardiac cath which demonstrated non obstructive disease.   Echo demonstrated mild AS.   He was seen by Dr. Ronal Ross and he had increased SOB.  He was sent for SPECT and there was no suggestion of ischemia.  EF was mildly low.   He has had moderate AS on Echo.  The last echo was March 2024 and this again demonstrated normal EF and moderate AS.     He presents for follow-up.  He is getting ready to go back to [place] wants to get a one-story or least a Child psychotherapist on the first floor somewhere [place] of here.  He plans to come back here for his health care however. The patient denies any new symptoms such as chest discomfort, neck or arm discomfort. There has been no new shortness of breath, PND or orthopnea. There have been no reported palpitations, presyncope or syncope. He has been doing yard work and getting the house ready.     ROS: As stated in the HPI and negative for all other systems.  Studies Reviewed:    EKG:     Sinus rhythm, rate 55, left axis deviation, left anterior fascicular block, bifid T waves, no acute ST-T wave changes.  Risk Assessment/Calculations:             Physical Exam:   VS:  BP (!) 146/76   Pulse (!) 55   Ht 5' 6 (1.676 m)   Wt 188 lb (85.3 kg)   SpO2 98%   BMI 30.34 kg/m    Wt Readings from Last 3 Encounters:  03/09/24 188 lb (85.3 kg)  01/20/23 179 lb (81.2 kg)  07/22/22 180 lb 6.4 oz (81.8 kg)    GENERAL:  Well appearing NECK:  No jugular venous distention, waveform within normal limits, carotid upstroke brisk and symmetric, questionable bruits versus transmitted systolic murmur , no thyromegaly LUNGS:  Clear to auscultation bilaterally CHEST:   Unremarkable HEART:  PMI not displaced or sustained,S1 and S2 within normal limits, no S3, no S4, no clicks, no rubs, 3 of 6 apical systolic murmur radiating at the aortic outflow tract and mid peaking, no diastolic urmurs ABD:  Flat, positive bowel sounds normal in frequency in pitch, no bruits, no rebound, no guarding, no midline pulsatile mass, no hepatomegaly, no splenomegaly EXT:  2 plus pulses throughout, no edema, no cyanosis no clubbing   ASSESSMENT AND PLAN:   BRADYCARDIA:   Has had no symptoms related to this.  No change in therapy.  AS:     This was moderate on echo in March 2024 and unchanged from 2023.  I will follow this in March 2026  CHEST PAIN:   Has had no new symptoms since stress perfusion testing in 2023.  AORTIC ATHEROSCLEROSIS.  We will participate with aggressive risk reduction.   ASCENDING AORTIC ANEURYSM:   This was 42 mm previously and I will follow this with echo next year.   DYSLIPIDEMIA: Patient's LDL was elevated to 117 most recently.  I did increase his Lipitor since that time and I will check a lipid profile.   BRUIT: I will check carotid Dopplers  HTN: Blood pressure is mildly elevated.  He is going to keep a blood pressure diary and I will suggest med titration based on these results.  Signed, Lynwood Schilling, MD

## 2024-03-09 ENCOUNTER — Ambulatory Visit: Payer: Self-pay | Attending: Cardiology | Admitting: Cardiology

## 2024-03-09 ENCOUNTER — Encounter: Payer: Self-pay | Admitting: Cardiology

## 2024-03-09 VITALS — BP 146/76 | HR 55 | Ht 66.0 in | Wt 188.0 lb

## 2024-03-09 DIAGNOSIS — R001 Bradycardia, unspecified: Secondary | ICD-10-CM

## 2024-03-09 DIAGNOSIS — I350 Nonrheumatic aortic (valve) stenosis: Secondary | ICD-10-CM

## 2024-03-09 DIAGNOSIS — M7989 Other specified soft tissue disorders: Secondary | ICD-10-CM

## 2024-03-09 DIAGNOSIS — I7121 Aneurysm of the ascending aorta, without rupture: Secondary | ICD-10-CM

## 2024-03-09 DIAGNOSIS — R0989 Other specified symptoms and signs involving the circulatory and respiratory systems: Secondary | ICD-10-CM

## 2024-03-09 DIAGNOSIS — E785 Hyperlipidemia, unspecified: Secondary | ICD-10-CM

## 2024-03-09 DIAGNOSIS — R072 Precordial pain: Secondary | ICD-10-CM | POA: Diagnosis not present

## 2024-03-09 NOTE — Patient Instructions (Signed)
 Medication Instructions:  Your physician recommends that you continue on your current medications as directed. Please refer to the Current Medication list given to you today.  *If you need a refill on your cardiac medications before your next appointment, please call your pharmacy*  Lab Work: Fasting lipid panel when you come for Carotid Doppler UltraSound If you have labs (blood work) drawn today and your tests are completely normal, you will receive your results only by: MyChart Message (if you have MyChart) OR A paper copy in the mail If you have any lab test that is abnormal or we need to change your treatment, we will call you to review the results.  Testing/Procedures: Carotid Doppler Ultrasound  Echo in March 2026 Your physician has requested that you have an echocardiogram. Echocardiography is a painless test that uses sound waves to create images of your heart. It provides your doctor with information about the size and shape of your heart and how well your heart's chambers and valves are working. This procedure takes approximately one hour. There are no restrictions for this procedure. Please do NOT wear cologne, perfume, aftershave, or lotions (deodorant is allowed). Please arrive 15 minutes prior to your appointment time.  Please note: We ask at that you not bring children with you during ultrasound (echo/ vascular) testing. Due to room size and safety concerns, children are not allowed in the ultrasound rooms during exams. Our front office staff cannot provide observation of children in our lobby area while testing is being conducted. An adult accompanying a patient to their appointment will only be allowed in the ultrasound room at the discretion of the ultrasound technician under special circumstances. We apologize for any inconvenience.    Follow-Up: At [facility], you and your health needs are our priority.  As part of our continuing mission to provide you with  exceptional heart care, our providers are all part of one team.  This team includes your primary Cardiologist (physician) and Advanced Practice Providers or APPs (Physician Assistants and Nurse Practitioners) who all work together to provide you with the care you need, when you need it.  Your next appointment:   1 year  Provider:   Lavona, MD  We recommend signing up for the patient portal called MyChart.  Sign up information is provided on this After Visit Summary.  MyChart is used to connect with patients for Virtual Visits (Telemedicine).  Patients are able to view lab/test results, encounter notes, upcoming appointments, etc.  Non-urgent messages can be sent to your provider as well.   To learn more about what you can do with MyChart, go to [email or web address].   Other Instructions Blood pressure diary: Take your blood pressure 2-3 times daily for 10 days and send us  the readings

## 2024-03-25 ENCOUNTER — Telehealth: Payer: Self-pay | Admitting: Cardiology

## 2024-03-25 ENCOUNTER — Ambulatory Visit: Payer: Self-pay | Admitting: Cardiology

## 2024-03-25 ENCOUNTER — Ambulatory Visit (HOSPITAL_COMMUNITY)
Admission: RE | Admit: 2024-03-25 | Discharge: 2024-03-25 | Disposition: A | Discharge status: Home / Self Care | Source: Ambulatory Visit | Attending: Cardiology | Admitting: Cardiology

## 2024-03-25 DIAGNOSIS — R0989 Other specified symptoms and signs involving the circulatory and respiratory systems: Secondary | ICD-10-CM | POA: Diagnosis present

## 2024-03-25 DIAGNOSIS — E785 Hyperlipidemia, unspecified: Secondary | ICD-10-CM

## 2024-03-25 NOTE — Telephone Encounter (Signed)
 Paper Work Dropped Off: Blood pressure readings  Date: 03/25/2024  Location of paper: Dr Lavona mail box

## 2024-03-26 LAB — LIPID PANEL
Chol/HDL Ratio: 3.1 ratio (ref 0.0–5.0)
Cholesterol, Total: 158 mg/dL (ref 100–199)
HDL: 51 mg/dL (ref >39)
LDL Chol Calc (NIH): 93 mg/dL (ref 0–99)
Triglycerides: 73 mg/dL (ref 0–149)
VLDL Cholesterol Cal: 14 mg/dL (ref 5–40)

## 2024-03-26 NOTE — Telephone Encounter (Signed)
 Blood pressure readings received and given to Dr. Lavona for review.

## 2024-03-29 MED ORDER — ATORVASTATIN CALCIUM 80 MG PO TABS: 80.0000 mg | ORAL_TABLET | Freq: Every day | ORAL | 3 refills | Status: AC

## 2024-03-29 NOTE — Telephone Encounter (Signed)
-----   Message from Lynwood Schilling sent at 03/26/2024  1:27 PM EDT ----- His LDL is not at target.  I would like to increase the Lipitor to 80 mg PO daily and check a lipid profile in 3 months. Call Mr. Teo with the results and send results to Rena Luke POUR, MD ----- Message ----- From: Interface, [facility] Lab Results In Sent: 03/26/2024   2:35 AM EDT To: Lynwood Schilling, MD

## 2024-03-29 NOTE — Telephone Encounter (Signed)
 Spoke with pt regarding his results. Pt aware of medication increase and lab draw in 3 months. Pt verbalized understanding. All questions if any were answered. Results sent to PCP

## 2024-04-06 ENCOUNTER — Telehealth: Payer: Self-pay

## 2024-04-06 MED ORDER — LOSARTAN POTASSIUM 50 MG PO TABS: 50.0000 mg | ORAL_TABLET | Freq: Two times a day (BID) | ORAL | 3 refills | Status: AC

## 2024-04-06 NOTE — Telephone Encounter (Signed)
 Spoke with pt regarding his blood pressure readings and Dr. Denver suggestions. Pt aware of taking 50 cozaar  twice daily. Prescription sent to pt's pharmacy of choice. Pt verbalized understanding. All questions if any were answered. BP readings uploaded to chart.

## 2024-04-28 ENCOUNTER — Ambulatory Visit: Admitting: Cardiology

## 2024-11-16 ENCOUNTER — Other Ambulatory Visit (HOSPITAL_COMMUNITY)
